# Patient Record
Sex: Female | Born: 1980 | Hispanic: Yes | Marital: Married | Smoking: Never smoker
Health system: Southern US, Community
[De-identification: ages and names within clinical notes are randomized; demographics above are authoritative.]

## PROBLEM LIST (undated history)

## (undated) DIAGNOSIS — K219 Gastro-esophageal reflux disease without esophagitis: Secondary | ICD-10-CM

## (undated) HISTORY — DX: Gastro-esophageal reflux disease without esophagitis: K21.9

---

## 2006-01-26 ENCOUNTER — Ambulatory Visit: Payer: Self-pay | Admitting: Family Medicine

## 2006-02-04 ENCOUNTER — Ambulatory Visit: Payer: Self-pay | Admitting: Family Medicine

## 2006-06-09 ENCOUNTER — Ambulatory Visit: Payer: Self-pay | Admitting: Advanced Practice Midwife

## 2006-06-17 ENCOUNTER — Ambulatory Visit: Payer: Self-pay | Admitting: Advanced Practice Midwife

## 2006-08-14 ENCOUNTER — Inpatient Hospital Stay: Payer: Self-pay | Admitting: Certified Nurse Midwife

## 2014-11-26 ENCOUNTER — Encounter: Payer: Self-pay | Admitting: Emergency Medicine

## 2014-11-26 ENCOUNTER — Emergency Department
Admission: EM | Admit: 2014-11-26 | Discharge: 2014-11-26 | Disposition: A | Discharge status: Home / Self Care | Payer: Self-pay | Attending: Student | Admitting: Student

## 2014-11-26 ENCOUNTER — Emergency Department: Payer: Self-pay

## 2014-11-26 ENCOUNTER — Other Ambulatory Visit: Payer: Self-pay

## 2014-11-26 DIAGNOSIS — K219 Gastro-esophageal reflux disease without esophagitis: Secondary | ICD-10-CM

## 2014-11-26 DIAGNOSIS — R52 Pain, unspecified: Secondary | ICD-10-CM

## 2014-11-26 DIAGNOSIS — R079 Chest pain, unspecified: Secondary | ICD-10-CM | POA: Insufficient documentation

## 2014-11-26 DIAGNOSIS — R1013 Epigastric pain: Secondary | ICD-10-CM

## 2014-11-26 DIAGNOSIS — Z3202 Encounter for pregnancy test, result negative: Secondary | ICD-10-CM | POA: Insufficient documentation

## 2014-11-26 LAB — CBC WITH DIFFERENTIAL/PLATELET
BASOS PCT: 1 %
Basophils Absolute: 0.1 10*3/uL (ref 0–0.1)
EOS ABS: 0.1 10*3/uL (ref 0–0.7)
Eosinophils Relative: 1 %
HCT: 35.9 % (ref 35.0–47.0)
Hemoglobin: 12.1 g/dL (ref 12.0–16.0)
LYMPHS ABS: 4.1 10*3/uL — AB (ref 1.0–3.6)
Lymphocytes Relative: 43 %
MCH: 30 pg (ref 26.0–34.0)
MCHC: 33.7 g/dL (ref 32.0–36.0)
MCV: 89 fL (ref 80.0–100.0)
MONO ABS: 0.9 10*3/uL (ref 0.2–0.9)
MONOS PCT: 10 %
Neutro Abs: 4.4 10*3/uL (ref 1.4–6.5)
Neutrophils Relative %: 45 %
Platelets: 364 10*3/uL (ref 150–440)
RBC: 4.03 MIL/uL (ref 3.80–5.20)
RDW: 13.8 % (ref 11.5–14.5)
WBC: 9.5 10*3/uL (ref 3.6–11.0)

## 2014-11-26 LAB — URINALYSIS COMPLETE WITH MICROSCOPIC (ARMC ONLY)
BILIRUBIN URINE: NEGATIVE
GLUCOSE, UA: NEGATIVE mg/dL
HGB URINE DIPSTICK: NEGATIVE
KETONES UR: NEGATIVE mg/dL
LEUKOCYTES UA: NEGATIVE
NITRITE: NEGATIVE
PH: 6 (ref 5.0–8.0)
Protein, ur: NEGATIVE mg/dL
RBC / HPF: NONE SEEN RBC/hpf (ref 0–5)
SPECIFIC GRAVITY, URINE: 1.002 — AB (ref 1.005–1.030)

## 2014-11-26 LAB — COMPREHENSIVE METABOLIC PANEL
ALBUMIN: 4 g/dL (ref 3.5–5.0)
ALT: 30 U/L (ref 14–54)
ANION GAP: 3 — AB (ref 5–15)
AST: 23 U/L (ref 15–41)
Alkaline Phosphatase: 69 U/L (ref 38–126)
BILIRUBIN TOTAL: 0.6 mg/dL (ref 0.3–1.2)
BUN: 15 mg/dL (ref 6–20)
CALCIUM: 8.7 mg/dL — AB (ref 8.9–10.3)
CO2: 26 mmol/L (ref 22–32)
Chloride: 109 mmol/L (ref 101–111)
Creatinine, Ser: 0.74 mg/dL (ref 0.44–1.00)
GFR calc non Af Amer: >60 mL/min (ref >60)
GLUCOSE: 107 mg/dL — AB (ref 65–99)
POTASSIUM: 3.7 mmol/L (ref 3.5–5.1)
SODIUM: 138 mmol/L (ref 135–145)
TOTAL PROTEIN: 7.4 g/dL (ref 6.5–8.1)

## 2014-11-26 LAB — TROPONIN I: Troponin I: <0.03 ng/mL (ref <0.031)

## 2014-11-26 LAB — LIPASE, BLOOD: Lipase: 19 U/L — ABNORMAL LOW (ref 22–51)

## 2014-11-26 LAB — POCT PREGNANCY, URINE: PREG TEST UR: NEGATIVE

## 2014-11-26 MED ORDER — OMEPRAZOLE 20 MG PO CPDR: 20.0000 mg | DELAYED_RELEASE_CAPSULE | Freq: Every day | ORAL | Status: AC

## 2014-11-26 MED ORDER — GI COCKTAIL ~~LOC~~
30.0000 mL | Freq: Once | ORAL | Status: AC
  Administered 2014-11-26: 30 mL via ORAL
  Filled 2014-11-26: qty 30

## 2014-11-26 NOTE — ED Notes (Signed)
Pt c/o epigastric pain with nausea and burning since Wednesday; left sided chest pain also since Wednesday; says she called the ambulance when the pain started that day but decided not to come in for evaluation; talking in complete coherent sentences

## 2014-11-26 NOTE — ED Notes (Signed)
Pt updated on progress of ultrasound report. Pt denies pain at present.

## 2014-11-26 NOTE — ED Notes (Signed)
Pt returned from ultrasound

## 2014-11-26 NOTE — ED Notes (Signed)
Pt updated on ultrasound procedure and progress to have exam done. Pt verbalizes understanding.

## 2014-11-26 NOTE — ED Provider Notes (Signed)
[facility] Emergency Department Provider Note  ____________________________________________  Time seen: Approximately 1:29 AM  I have reviewed the triage vital signs and the nursing notes.   HISTORY  Chief Complaint Chest Pain; Abdominal Pain; and Nausea    HPI Jodi Hamilton is a 34 y.o. female with no chronic medical problems who presents for gradual onset epigastric abdominal discomfort this evening which awoke her from sleep, currently mild. Patient reports that she awoke with burning pain in the epigastrium that radiated up into her chest and into her head. She felt warm all over. + nausea.  No vomiting, diarrhea, fevers or chills patient a similar episode 2 days ago but it resolved. She also reports yesterday she had some left-sided chest pain which was transient and resolved and she has not had any chest pain since that time. No family history of early coronary artery disease, no family history or personal history of PE or DVT, no family history of sudden cardiac death. There are no modifying factors.   History reviewed. No pertinent past medical history.  There are no active problems to display for this patient.   History reviewed. No pertinent past surgical history.  No current outpatient prescriptions on file.  Allergies Review of patient's allergies indicates no known allergies.  History reviewed. No pertinent family history.  Social History Social History  Substance Use Topics  . Smoking status: Never Smoker   . Smokeless tobacco: Never Used  . Alcohol Use: No    Review of Systems Constitutional: No fever/chills Eyes: No visual changes. ENT: No sore throat. Cardiovascular: +chest pain. Respiratory: Denies shortness of breath. Gastrointestinal: +abdominal pain.  + nausea, no vomiting.  No diarrhea.  No constipation. Genitourinary: Negative for dysuria. Musculoskeletal: Negative for back pain. Skin: Negative for  rash. Neurological: Negative for headaches, focal weakness or numbness.  10-point ROS otherwise negative.  ____________________________________________   PHYSICAL EXAM:  VITAL SIGNS: ED Triage Vitals  Enc Vitals Group     BP 11/26/14 0125 145/77 mmHg     Pulse Rate 11/26/14 0125 71     Resp 11/26/14 0125 18     Temp 11/26/14 0125 98.1 F (36.7 C)     Temp Source 11/26/14 0125 Oral     SpO2 11/26/14 0125 100 %     Weight 11/26/14 0125 180 lb (81.647 kg)     Height 11/26/14 0125  (1.651 m)     Head Cir --      Peak Flow --      Pain Score 11/26/14 0126 8     Pain Loc --      Pain Edu? --      Excl. in GC? --     Constitutional: Alert and oriented. Well appearing and in no acute distress. Eyes: Conjunctivae are normal. PERRL. EOMI. Head: Atraumatic. Nose: No congestion/rhinnorhea. Mouth/Throat: Mucous membranes are moist.  Oropharynx non-erythematous. Neck: No stridor.   Cardiovascular: Normal rate, regular rhythm. Grossly normal heart sounds.  Good peripheral circulation. Respiratory: Normal respiratory effort.  No retractions. Lungs CTAB. Gastrointestinal: Soft with mild tenderness to palpation in the epigastrium and the right upper quadrant. No CVA tenderness. Genitourinary: deferred Musculoskeletal: No lower extremity tenderness nor edema.  No joint effusions. Neurologic:  Normal speech and language. No gross focal neurologic deficits are appreciated. No gait instability. Skin:  Skin is warm, dry and intact. No rash noted. Psychiatric: Mood and affect are normal. Speech and behavior are normal.  ____________________________________________   LABS (all labs  ordered are listed, but only abnormal results are displayed)  Labs Reviewed  CBC WITH DIFFERENTIAL/PLATELET - Abnormal; Notable for the following:    Lymphs Abs 4.1 (*)    All other components within normal limits  COMPREHENSIVE METABOLIC PANEL - Abnormal; Notable for the following:    Glucose, Bld 107  (*)    Calcium 8.7 (*)    Anion gap 3 (*)    All other components within normal limits  LIPASE, BLOOD - Abnormal; Notable for the following:    Lipase 19 (*)    All other components within normal limits  URINALYSIS COMPLETEWITH MICROSCOPIC ([facility] ONLY) - Abnormal; Notable for the following:    Color, Urine COLORLESS (*)    APPearance CLEAR (*)    Specific Gravity, Urine 1.002 (*)    Bacteria, UA RARE (*)    Squamous Epithelial / LPF 0-5 (*)    All other components within normal limits  TROPONIN I  POC URINE PREG, ED  POCT PREGNANCY, URINE   ____________________________________________  EKG  ED ECG REPORT I, Gayla Doss, the attending physician, personally viewed and interpreted this ECG.   Date: 11/26/2014  EKG Time: 01:44  Rate: 69  Rhythm: normal sinus rhythm  Axis: normal  Intervals:none  ST&T Change: No acute ST elevation. Nonspecific T-wave flattening in lead 3 but otherwise normal EKG.  ____________________________________________  RADIOLOGY  Chest x-ray  IMPRESSION: No active cardiopulmonary disease.    Right upper quadrant ultrasound  IMPRESSION: Normal examination. ____________________________________________   PROCEDURES  Procedure(s) performed: None  Critical Care performed: No  ____________________________________________   INITIAL IMPRESSION / ASSESSMENT AND PLAN / ED COURSE  Pertinent labs & imaging results that were available during my care of the patient were reviewed by me and considered in my medical decision making (see chart for details).  Jodi Hamilton is a 34 y.o. female with no chronic medical problems who presents for gradual onset epigastric abdominal discomfort this evening which awoke her from sleep. On exam, she is well appearing and in NAD. VSS. Afebrile. She has mild tenderness in the epigastrium and the RUQ.  Symptoms may be related to GERD versus cholelithiasis or acute cholecystitis. EKG is reassuring there is  no evidence of acute ischemia. She last had chest pain yesterday. We'll check one troponin I doubt ACS in this patient. PERC negative and I doubt PE. We'll check screening chest x-ray. Plan for abdominal pain labs, urinalysis, urine pregnancy as well as right upper quadrant ultrasound to evaluate for any acute pathology. Reassess for disposition. We'll treat her symptomatically.  ----------------------------------------- 6:05 AM on 11/26/2014 ----------------------------------------- Labs reviewed. Normal CBC CMP and lipase. Urinalysis is not consistent with infection. Negative urine pregnancy test. Troponin negative. Chest x-ray clear. Ultrasound of the gallbladder is also negative. Patient's symptoms have improved significantly after GI cocktail. Suspect her symptoms may be related to GERD. We discussed return precautions, use of omeprazole, need for close PCP follow-up and she is comfortable with the discharge plan.  ____________________________________________   FINAL CLINICAL IMPRESSION(S) / ED DIAGNOSES  Final diagnoses:  Epigastric abdominal pain  Pain  Gastroesophageal reflux disease, esophagitis presence not specified      Gayla Doss, MD 11/26/14 [phone]

## 2014-11-26 NOTE — ED Notes (Signed)
Patient transported to Ultrasound 

## 2014-11-26 NOTE — ED Notes (Signed)
Pt denies pain at this time

## 2014-11-29 ENCOUNTER — Emergency Department: Payer: Worker's Compensation

## 2014-11-29 ENCOUNTER — Encounter: Payer: Self-pay | Admitting: Emergency Medicine

## 2014-11-29 ENCOUNTER — Emergency Department
Admission: EM | Admit: 2014-11-29 | Discharge: 2014-11-29 | Disposition: A | Discharge status: Home / Self Care | Payer: Worker's Compensation | Attending: Emergency Medicine | Admitting: Emergency Medicine

## 2014-11-29 DIAGNOSIS — Y9389 Activity, other specified: Secondary | ICD-10-CM | POA: Insufficient documentation

## 2014-11-29 DIAGNOSIS — Z79899 Other long term (current) drug therapy: Secondary | ICD-10-CM | POA: Insufficient documentation

## 2014-11-29 DIAGNOSIS — S50811A Abrasion of right forearm, initial encounter: Secondary | ICD-10-CM | POA: Insufficient documentation

## 2014-11-29 DIAGNOSIS — S51831A Puncture wound without foreign body of right forearm, initial encounter: Secondary | ICD-10-CM | POA: Insufficient documentation

## 2014-11-29 DIAGNOSIS — W540XXA Bitten by dog, initial encounter: Secondary | ICD-10-CM | POA: Insufficient documentation

## 2014-11-29 DIAGNOSIS — Y998 Other external cause status: Secondary | ICD-10-CM | POA: Insufficient documentation

## 2014-11-29 DIAGNOSIS — Y9259 Other trade areas as the place of occurrence of the external cause: Secondary | ICD-10-CM | POA: Insufficient documentation

## 2014-11-29 DIAGNOSIS — Z23 Encounter for immunization: Secondary | ICD-10-CM | POA: Insufficient documentation

## 2014-11-29 MED ORDER — AMOXICILLIN-POT CLAVULANATE 875-125 MG PO TABS: 1.0000 | ORAL_TABLET | Freq: Two times a day (BID) | ORAL | Status: DC

## 2014-11-29 MED ORDER — ONDANSETRON 4 MG PO TBDP
4.0000 mg | ORAL_TABLET | Freq: Once | ORAL | Status: AC
  Administered 2014-11-29: 4 mg via ORAL
  Filled 2014-11-29: qty 1

## 2014-11-29 MED ORDER — RABIES IMMUNE GLOBULIN 150 UNIT/ML IM INJ
20.0000 [IU]/kg | INJECTION | Freq: Once | INTRAMUSCULAR | Status: AC
  Administered 2014-11-29: 1650 [IU] via INTRAMUSCULAR
  Filled 2014-11-29: qty 11

## 2014-11-29 MED ORDER — RABIES VACCINE, PCEC IM SUSR
1.0000 mL | Freq: Once | INTRAMUSCULAR | Status: AC
  Administered 2014-11-29: 1 mL via INTRAMUSCULAR
  Filled 2014-11-29: qty 1

## 2014-11-29 MED ORDER — TETANUS-DIPHTH-ACELL PERTUSSIS 5-2.5-18.5 LF-MCG/0.5 IM SUSP
0.5000 mL | Freq: Once | INTRAMUSCULAR | Status: AC
  Administered 2014-11-29: 0.5 mL via INTRAMUSCULAR
  Filled 2014-11-29: qty 0.5

## 2014-11-29 MED ORDER — AMOXICILLIN-POT CLAVULANATE 875-125 MG PO TABS
1.0000 | ORAL_TABLET | Freq: Once | ORAL | Status: AC
  Administered 2014-11-29: 1 via ORAL
  Filled 2014-11-29: qty 1

## 2014-11-29 NOTE — ED Notes (Signed)
Pt was working at [employer] when she walked down the hall and was bitten by a person who was walking their dog.  No information about the dog at this time.

## 2014-11-29 NOTE — ED Provider Notes (Signed)
[facility] Emergency Department Provider Note  ____________________________________________  Time seen: Approximately 12:09 PM  I have reviewed the triage vital signs and the nursing notes.   HISTORY  Chief Complaint Animal Bite    HPI Jodi Hamilton is a 34 y.o. female who presents to the emergency department for evaluation of a dog bite. 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 She states that the lesion was not tight enough and the dog jumped up and bit her right arm. She states that it appeared to be enlarged, possibly a pit bull. She states that her supervisor notified animal control. With animal control arrived, the owners had left with the dog. The owners had stated that the dog was up-to-date on his vaccinations, but she has no idea where the dog is now. The owners checked out of the hotel.   History reviewed. No pertinent past medical history.  There are no active problems to display for this patient.   History reviewed. No pertinent past surgical history.  Current Outpatient Rx  Name  Route  Sig  Dispense  Refill  . amoxicillin-clavulanate (AUGMENTIN) 875-125 MG tablet   Oral   Take 1 tablet by mouth 2 (two) times daily.   20 tablet   0   . omeprazole (PRILOSEC) 20 MG capsule   Oral   Take 1 capsule (20 mg total) by mouth daily.   30 capsule   0     Allergies Review of patient's allergies indicates no known allergies.  No family history on file.  Social History Social History  Substance Use Topics  . Smoking status: Never Smoker   . Smokeless tobacco: Never Used  . Alcohol Use: No    Review of Systems   Constitutional: No fever/chills Eyes: No visual changes. ENT: No congestion or rhinorrhea Cardiovascular: Denies chest pain. Respiratory: Denies shortness of breath. Gastrointestinal: No abdominal pain.  No nausea, no vomiting.  No diarrhea.  No  constipation. Genitourinary: Negative for dysuria. Musculoskeletal: Negative for back pain. Skin: Positive for bite/laceration to the right upper forearm. Neurological: Negative for headaches, focal weakness or numbness.  10-point ROS otherwise negative.  ____________________________________________   PHYSICAL EXAM:  VITAL SIGNS: ED Triage Vitals  Enc Vitals Group     BP 11/29/14 1145 137/83 mmHg     Pulse Rate 11/29/14 1145 83     Resp 11/29/14 1145 18     Temp 11/29/14 1145 98.1 F (36.7 C)     Temp Source 11/29/14 1145 Oral     SpO2 11/29/14 1145 100 %     Weight 11/29/14 1207 180 lb (81.647 kg)     Height 11/29/14 1207  (1.651 m)     Head Cir --      Peak Flow --      Pain Score 11/29/14 1145 6     Pain Loc --      Pain Edu? --      Excl. in GC? --     Constitutional: Alert and oriented. Well appearing and in no acute distress. Eyes: Conjunctivae are normal. PERRL. EOMI. Head: Atraumatic. Nose: No congestion/rhinnorhea. Mouth/Throat: Mucous membranes are moist.  Oropharynx non-erythematous. No oral lesions. Neck: No stridor. Cardiovascular: Normal rate, regular rhythm.  Good peripheral circulation. Respiratory: Normal respiratory effort.  No retractions. Lungs CTAB. Gastrointestinal: Soft and nontender. No distention. No abdominal bruits.  Musculoskeletal: No lower extremity tenderness nor edema.  No joint effusions. Right lateral forearm/elbow tender to palpation. Full range of motion of the right elbow. Neurologic:  Normal speech and language. No gross focal neurologic deficits are appreciated. Speech is normal. No gait instability. Skin:  Small puncture wound on the anterior aspect of the upper right forearm, abrasion to the lateral right forearm; Negative for petechiae.  Psychiatric: Mood and affect are normal. Speech and behavior are normal.  ____________________________________________   LABS (all labs ordered are listed, but only abnormal results are  displayed)  Labs Reviewed - No data to display ____________________________________________  EKG   ____________________________________________  RADIOLOGY  No acute bony abnormality or foreign body noted in the right elbow. ____________________________________________   PROCEDURES  Procedure(s) performed: None ____________________________________________   INITIAL IMPRESSION / ASSESSMENT AND PLAN / ED COURSE  Pertinent labs & imaging results that were available during my care of the patient were reviewed by me and considered in my medical decision making (see chart for details).  Because the whereabouts of the dog is unknown and there is no way to confirm that the vaccinations are up-to-date, patient will undergo rabies series vaccinations. This was discussed with the use of Maryjane HurterOtto, our [place] interpreter. Patient agrees to the treatment and understands that she will need to return for the remainder of the series. ____________________________________________   FINAL CLINICAL IMPRESSION(S) / ED DIAGNOSES  Final diagnoses:  Dog bite  Rabies, need for prophylactic vaccination against       Chinita PesterCari B Rudy Luhmann, FNP 11/29/14 1416  Sharyn CreamerMark Quale, MD 11/29/14 1557

## 2014-11-29 NOTE — ED Notes (Signed)
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

## 2014-12-03 ENCOUNTER — Emergency Department
Admission: EM | Admit: 2014-12-03 | Discharge: 2014-12-03 | Disposition: A | Discharge status: Home / Self Care | Payer: Self-pay | Attending: Emergency Medicine | Admitting: Emergency Medicine

## 2014-12-03 ENCOUNTER — Encounter: Payer: Self-pay | Admitting: Emergency Medicine

## 2014-12-03 DIAGNOSIS — Z79899 Other long term (current) drug therapy: Secondary | ICD-10-CM | POA: Insufficient documentation

## 2014-12-03 DIAGNOSIS — Z792 Long term (current) use of antibiotics: Secondary | ICD-10-CM | POA: Insufficient documentation

## 2014-12-03 DIAGNOSIS — Z23 Encounter for immunization: Secondary | ICD-10-CM | POA: Insufficient documentation

## 2014-12-03 DIAGNOSIS — Z203 Contact with and (suspected) exposure to rabies: Secondary | ICD-10-CM

## 2014-12-03 MED ORDER — RABIES VACCINE, PCEC IM SUSR
1.0000 mL | Freq: Once | INTRAMUSCULAR | Status: AC
  Administered 2014-12-03: 1 mL via INTRAMUSCULAR
  Filled 2014-12-03: qty 1

## 2014-12-03 NOTE — ED Notes (Signed)
Here for repeat rabies shot

## 2014-12-03 NOTE — ED Notes (Signed)
Only here for rabies vaccine

## 2014-12-03 NOTE — ED Provider Notes (Signed)
[facility] Emergency Department Provider Note ____________________________________________  Time seen: 1054  I have reviewed the triage vital signs and the nursing notes.  HISTORY  Chief Complaint  Rabies Injection  HPI Jodi Hamilton is a 34 y.o. female reports to the ED for routine rabies vaccine #2 of 4 today. She denies any significant interim symptoms related to the dog bite site or the vaccine itself. She does endorse some intermittent anxiety and insomnia related to the recent dog bite. She will reports she has had some poor sleep over the last several nights, and also has been bothered by the barking of her son's pet puppy. She describes a sense of holding pressure in her stomach and chest as well as some sweats and nausea. She denies any syncope, distal paresthesias, or focal weakness. She has not dosed any medications by prescription or over-the-counter for sleep induction or anxiety.  History reviewed. No pertinent past medical history.  There are no active problems to display for this patient.  History reviewed. No pertinent past surgical history.  Current Outpatient Rx  Name  Route  Sig  Dispense  Refill  . amoxicillin-clavulanate (AUGMENTIN) 875-125 MG tablet   Oral   Take 1 tablet by mouth 2 (two) times daily.   20 tablet   0   . omeprazole (PRILOSEC) 20 MG capsule   Oral   Take 1 capsule (20 mg total) by mouth daily.   30 capsule   0    Allergies Review of patient's allergies indicates no known allergies.  No family history on file.  Social History Social History  Substance Use Topics  . Smoking status: Never Smoker   . Smokeless tobacco: Never Used  . Alcohol Use: No   Review of Systems  Constitutional: Negative for fever. Eyes: Negative for visual changes. ENT: Negative for sore throat. Cardiovascular: Negative for chest pain. Respiratory: Negative for shortness of breath. Gastrointestinal: Negative for abdominal  pain, vomiting and diarrhea. Genitourinary: Negative for dysuria. Musculoskeletal: Negative for back pain. Skin: Negative for rash. Neurological: Negative for headaches, focal weakness or numbness.  Psychological: Mild insomnia and anxiety as above. ____________________________________________  PHYSICAL EXAM:  VITAL SIGNS: ED Triage Vitals  Enc Vitals Group     BP 12/03/14 1033 126/78 mmHg     Pulse Rate 12/03/14 1033 62     Resp 12/03/14 1033 18     Temp 12/03/14 1033 98.5 F (36.9 C)     Temp Source 12/03/14 1033 Oral     SpO2 12/03/14 1033 98 %     Weight 12/03/14 1025 190 lb (86.183 kg)     Height 12/03/14 1025  (1.651 m)     Head Cir --      Peak Flow --      Pain Score --      Pain Loc --      Pain Edu? --      Excl. in GC? --    Constitutional: Alert and oriented. Well appearing and in no distress. Head: Normocephalic and atraumatic.      Eyes: Conjunctivae are normal. PERRL. Normal extraocular movements      Ears: Canals clear. TMs intact bilaterally.   Nose: No congestion/rhinorrhea.   Mouth/Throat: Mucous membranes are moist.   Neck: Supple. No thyromegaly. Hematological/Lymphatic/Immunological: No cervical lymphadenopathy. Cardiovascular: Normal rate, regular rhythm.  Respiratory: Normal respiratory effort. No wheezes/rales/rhonchi. Gastrointestinal: Soft and nontender. No distention. Musculoskeletal: Nontender with normal range of motion in all extremities.  Neurologic:  Normal gait without ataxia. Normal speech and language. No gross focal neurologic deficits are appreciated. Skin:  Skin is warm, dry and intact. No rash noted. Well healed dog bite to the right elbow. Psychiatric: Mood and affect are normal. Patient exhibits appropriate insight and judgment. ____________________________________________  PROCEDURES  Rabavert 1 ml IM ____________________________________________  INITIAL IMPRESSION / ASSESSMENT AND PLAN / ED COURSE  Patient  turns to the ED for rabies vaccine injection #2 of 4. Patient is encouraged to began over-the-counter pain medicine + sleep a combination, for her current intermittent complaints of insomnia. She will follow-up with provider here in 1 week for reevaluation and consideration of a prescription sleep/anxiety prescription. ____________________________________________  FINAL CLINICAL IMPRESSION(S) / ED DIAGNOSES  Final diagnoses:  Need for post exposure prophylaxis for rabies       Lissa HoardJenise V Bacon Adael Culbreath, PA-C 12/03/14 1137  Jene Everyobert Kinner, MD 12/03/14 1150

## 2014-12-03 NOTE — Discharge Instructions (Signed)
Vacuna contra la rabia, Lo que usted necesita saber (Rabies Vaccine: What You Need to Know) QU ES LA RABIA?  La rabia es una enfermedad grave. Es causada por un virus.  La rabia es una enfermedad que principalmente afecta a los [place]. Los humanos contraen la rabia cuando son mordidos por animales infectados.  Al principio puede que no haya ningn sntoma. Pero semanas, o incluso meses despus de [place] mordida, la rabia puede causar dolor, fatiga, dolores de [place], fiebre e irritabilidad. Despus de estos sntomas se presentan convulsiones, alucinaciones y parlisis. La rabia humana casi siempre es mortal.  Los animales salvajes (especialmente los murcilagos) son la fuente ms comn de la infeccin de la rabia [employer] [place]. Zorrillos, mapaches, perros, gatos, coyotes, zorros, y otros mamferos tambin que pueden transmitir la enfermedad.  La rabia humana es poco frecuente en los [place]. Solamente se han diagnosticado 55 caso desde 1990. Sin embargo, entre 16,000 y 39,000 personas son vacunadas cada ao como precaucin despus de haber sido mordidas por animales. Adems, la rabia es mucho ms comn en otras partes del mundo, con aproximadamente 40,000 a 70,000 muertes relacionadas con la rabia a nivel mundial cada ao. Las mordidas de perros que no han sido vacunados causan la mayora de [place]. La vacuna contra la rabia puede prevenir la rabia. VACUNA CONTRA LA RABIA  La vacuna contra la rabia se aplica a las personas que estn en alto riesgo de contraer rabia para protegerlas si son expuestas. Tambin puede prevenir la enfermedad si se aplica a una persona despus de que haya sido expuesta.  La vacuna contra la rabia est hecha de virus muerto de la rabia. No puede causar rabia. QUIN DEBE VACUNARSE CONTRA LA RABIA Y CUNDO? Madilyn Fireman preventiva (sin exposicin)  A las personas con alto riesgo de Primary school teacher rabia, como veterinarios, encargados de Administrator,  trabajadores de laboratorio que manipulan el virus de la rabia, espelelogos y Museum/gallery exhibitions officer de produccin de compuestos biolgicos relacionados con la rabia se les debe ofrecer la vacuna contra la rabia.  La vacuna tambin debe considerarse para:  Las personas cuyas actividades las expongan a Pharmacologist frecuente con el virus de la rabia o con animales posiblemente rabiosos.  Los viajeros internacionales que podran entrar en contacto con animales en lugares del mundo en donde la rabia es comn.  El esquema de preexposicin para la vacuna contra la rabia es de 3 dosis, administradas en los siguientes momentos:  Dosis 1: Segn corresponda.  Dosis 2: 7 das despus de la Dosis 1.  Dosis 3: 21 o 28 das despus de la Dosis 1.  Para los trabajadores de laboratorios y otros que podran estar expuestos de forma repetida al virus de la rabia, se recomiendan anlisis peridicos de inmunidad y se deben aplicar dosis de refuerzo segn sea necesario. (Los [employer] o las dosis de refuerzo no se recomiendan para los viajeros). Pdale ms detalles a su mdico. Vacuna despus de la exposicin Cualquier persona que haya sido mordida por un animal, o que haya estado expuesta al virus de la rabia de otra forma debe limpiarse la herida y acudir con un mdico de inmediato. El mdico determinar si necesita vacunarse.  Una persona que haya estado expuesta y que nunca haya sido vacunada contra la rabia debe recibir 4 dosis de la vacuna contra la rabia: Neomia Dear dosis de inmediato y dosis adicionales al 3., 7. y 48. das. Tambin deben recibir otra inyeccin que se llama inmunoglobulina contra la rabia al  mismo tiempo que la primera dosis.  Una persona que ha sido vacunada previamente debe recibir 2 dosis de la vacuna contra la rabia: Neomia Dearuna de inmediato y otra al 3. da. No es necesario que se le aplique la inmunoglobulina contra la rabia. INFORME A SU MDICO SI: Hable con un mdico antes de vacunarse contra la rabia  si:  Ha tenido Freight forwarderalguna reaccin alrgica grave (que pueda poner en peligro la vida) a una dosis anterior de la vacuna contra la rabia o a algn componente de la vacuna; informe a su mdico si tiene alguna alergia severa.  Su sistema inmunitario est debilitado a causa de:  VIH, SIDA, u otra enfermedad que afecte al sistema inmunitario.  Un tratamiento con frmacos que afectan al sistema inmunitario, como los esteroides.  Cncer, o tratamiento para el cncer con radiacin o medicamentos. Si tiene United Parceluna enfermedad menor, como gripe, puede vacunarse. Si tiene una enfermedad moderada o severa, tal vez debera esperar hasta recuperarse antes de recibir una dosis de rutina (sin exposicin) de la vacuna contra la rabia. Si ha estado expuesto al virus de la rabia, debe vacunarse independientemente de cualquier otra enfermedad que pudiera tener. CULES SON LOS RIESGOS DE LA VACUNA CONTRA LA RABIA? Cathleen CortiUna vacuna, al igual que cualquier [place] Lakemedicina, puede provocar problemas graves, como reacciones Economistalrgicas severas. El riesgo de que una vacuna ocasione un dao grave, o la Munsonmuerte, es extremadamente pequeo. Los problemas graves provocados por la vacuna contra la rabia son Lynnae Sandhoffmuy raros.  Problemas leves:  Dolor, enrojecimiento, hinchazn o comezn en donde se aplic la inyeccin (30% a 74%).  Dolor de cabeza, nuseas, dolor abdominal, dolores musculares, o mareos (5% a 40%). Problemas moderados:  Urticaria, dolor en las articulaciones, o fiebre (aproximadamente 6% de las dosis de refuerzo).  Se han reportado otros trastornos del Harley-Davidsonsistema nervioso, como el sndrome de Scientific laboratory technicianGuillain Barr (GBS), despus de recibir la vacuna contra la rabia, pero esto sucede con tan poca frecuencia que no se sabe si estn relacionados con la vacuna. Nota: Muchas marcas de vacunas contra la rabia estn disponibles en los [place], y las reacciones pueden variar dependiendo de Radio producercada marca. Su proveedor le puede dar ms informacin sobre  una marca en particular.  QU HACER EN CASO DE UNA REACCIN MODERADA O SEVERA? De qu debo estar pendiente? De todo signo inusual, como una reaccin alrgica severa o fiebre alta. Si ocurriera una reaccin alrgica severa, esta debera presentarse en cuestin de minutos y Angelena Formhasta una hora despus de la inyeccin. Los signos de Runner, broadcasting/film/videouna reaccin alrgica grave pueden incluir dificultad para respirar, debilidad, ronquera o jadeos, pulso acelerado, urticaria, mareos, palidez o hinchazn de la garganta. Qu debo hacer?  Llame a un mdico o lleve a la persona al mdico de inmediato.  Dgale al mdico lo que ocurri, la fecha y la hora en la que ocurri, y cundo le pusieron la vacuna.  Pdale a su proveedor que reporte la reaccin presentando un formulario del Sistema de reporte de eventos adversos derivados de las vacuna (Vaccine Adverse Event Reporting System, VAERS). O puede presentar este reporte a travs del sitio web de VAERS en www.vaers.LAgents.nohhs.gov o llamando al [phone]. El VAERS no ofrece consejos mdicos. DNDE Roxan DieselPUEDO OBTENER MS INFORMACIN?  Consulte a su mdico o a otro proveedor de servicios mdicos. Ellos pueden darle el folleto informativo de la vacuna o sugerirle otras fuentes de informacin.  Llame al departamento de salud local o estatal.  Comunquese con los Centers para Air traffic controllerel Control y Prevencin de  Enfermedades Systems developer for Disease Control snd Prevention, CDC).  Visite el sitio web sobre la rabia de los CDC en SouthwestBand.no CDC Rabies Vaccine-Spanish VIS (11/22/07)   Esta informacin no tiene Theme park manager el consejo del mdico. Asegrese de hacerle al mdico cualquier pregunta que tenga.   Document Released: 05/01/2008 Document Revised: 06/19/2014 Elsevier Interactive Patient Education [employer]  Rabies Vaccine Schedule Day 0 -   11/30/14 - DONE Day 3 -    12/03/14 - DONE Day 7 -    12/07/14 - #3 Day 14 -  12/14/14 - #4

## 2014-12-08 ENCOUNTER — Emergency Department
Admission: EM | Admit: 2014-12-08 | Discharge: 2014-12-08 | Disposition: A | Discharge status: Home / Self Care | Payer: Self-pay | Attending: Emergency Medicine | Admitting: Emergency Medicine

## 2014-12-08 DIAGNOSIS — Z23 Encounter for immunization: Secondary | ICD-10-CM | POA: Insufficient documentation

## 2014-12-08 DIAGNOSIS — Z792 Long term (current) use of antibiotics: Secondary | ICD-10-CM | POA: Insufficient documentation

## 2014-12-08 DIAGNOSIS — Z79899 Other long term (current) drug therapy: Secondary | ICD-10-CM | POA: Insufficient documentation

## 2014-12-08 MED ORDER — RABIES VACCINE, PCEC IM SUSR
1.0000 mL | Freq: Once | INTRAMUSCULAR | Status: AC
  Administered 2014-12-08: 1 mL via INTRAMUSCULAR
  Filled 2014-12-08: qty 1

## 2014-12-08 NOTE — ED Provider Notes (Signed)
[facility] Emergency Department Provider Note  ____________________________________________  Time seen: Approximately 9:54 AM  I have reviewed the triage vital signs and the nursing notes.   HISTORY  Chief Complaint Rabies Injection  HPI Jodi Hamilton is a 34 y.o. female is here for day 7 rabies vaccine. The patient denies any problems with her wound and has nearly healed up completely without any signs of infection.   No past medical history on file.  There are no active problems to display for this patient.   No past surgical history on file.  Current Outpatient Rx  Name  Route  Sig  Dispense  Refill  . amoxicillin-clavulanate (AUGMENTIN) 875-125 MG tablet   Oral   Take 1 tablet by mouth 2 (two) times daily.   20 tablet   0   . omeprazole (PRILOSEC) 20 MG capsule   Oral   Take 1 capsule (20 mg total) by mouth daily.   30 capsule   0     Allergies Review of patient's allergies indicates no known allergies.  No family history on file.  Social History Social History  Substance Use Topics  . Smoking status: Never Smoker   . Smokeless tobacco: Never Used  . Alcohol Use: No    Review of Systems Constitutional: No fever/positive chills at 4 AM but reports no chills since that time. Cardiovascular: Denies chest pain. Respiratory: Denies shortness of breath. Gastrointestinal: .  No nausea, no vomiting. Musculoskeletal: Negative for back pain. Skin: Negative for rash. Neurological: Negative for headaches, focal weakness or numbness.  10-point ROS otherwise negative.  ____________________________________________   PHYSICAL EXAM:  VITAL SIGNS: ED Triage Vitals  Enc Vitals Group     BP 12/08/14 0935 110/78 mmHg     Pulse Rate 12/08/14 0935 67     Resp 12/08/14 0935 18     Temp 12/08/14 0935 98.7 F (37.1 C)     Temp Source 12/08/14 0935 Oral     SpO2 12/08/14 0935 100 %     Weight 12/08/14 0935 180 lb (81.647 kg)   Height 12/08/14 0935 5\' 5"  (1.651 m)     Head Cir --      Peak Flow --      Pain Score --      Pain Loc --      Pain Edu? --      Excl. in GC? --     Constitutional: Alert and oriented. Well appearing and in no acute distress. Eyes: Conjunctivae are normal. PERRL. EOMI. Head: Atraumatic. Nose: No congestion/rhinnorhea. Respiratory: Normal respiratory effort.  No retractions. Lungs CTAB. Skin:  Skin is warm, dry. Right elbow with superficial healing wound. No signs of infection were seen. Psychiatric: Mood and affect are normal. Speech and behavior are normal.  ____________________________________________   LABS (all labs ordered are listed, but only abnormal results are displayed)  Labs Reviewed - No data to display ____________________________________________   PROCEDURES  Procedure(s) performed: None  Critical Care performed: No  ____________________________________________   INITIAL IMPRESSION / ASSESSMENT AND PLAN / ED COURSE  Pertinent labs & imaging results that were available during my care of the patient were reviewed by me and considered in my medical decision making (see chart for details).  Patient was given her rabies injection. ____________________________________________   FINAL CLINICAL IMPRESSION(S) / ED DIAGNOSES  Final diagnoses:  Need for prophylactic vaccination against rabies      Tommi RumpsRhonda L Summers, PA-C 12/08/14 1031  Sharyn CreamerMark Quale, MD 12/08/14 [phone]

## 2014-12-08 NOTE — ED Notes (Signed)
Pt tolerated Rabies vaccine without difficulty.

## 2014-12-08 NOTE — ED Notes (Signed)
Day 7 rabies vaccine

## 2014-12-20 ENCOUNTER — Emergency Department
Admission: EM | Admit: 2014-12-20 | Discharge: 2014-12-20 | Disposition: A | Discharge status: Home / Self Care | Payer: Self-pay | Attending: Emergency Medicine | Admitting: Emergency Medicine

## 2014-12-20 ENCOUNTER — Encounter: Payer: Self-pay | Admitting: Emergency Medicine

## 2014-12-20 ENCOUNTER — Emergency Department: Payer: Self-pay

## 2014-12-20 DIAGNOSIS — Z23 Encounter for immunization: Secondary | ICD-10-CM | POA: Insufficient documentation

## 2014-12-20 DIAGNOSIS — R05 Cough: Secondary | ICD-10-CM | POA: Insufficient documentation

## 2014-12-20 DIAGNOSIS — H9203 Otalgia, bilateral: Secondary | ICD-10-CM | POA: Insufficient documentation

## 2014-12-20 DIAGNOSIS — R0981 Nasal congestion: Secondary | ICD-10-CM | POA: Insufficient documentation

## 2014-12-20 DIAGNOSIS — Z792 Long term (current) use of antibiotics: Secondary | ICD-10-CM | POA: Insufficient documentation

## 2014-12-20 DIAGNOSIS — R6883 Chills (without fever): Secondary | ICD-10-CM | POA: Insufficient documentation

## 2014-12-20 DIAGNOSIS — R300 Dysuria: Secondary | ICD-10-CM | POA: Insufficient documentation

## 2014-12-20 DIAGNOSIS — R350 Frequency of micturition: Secondary | ICD-10-CM | POA: Insufficient documentation

## 2014-12-20 DIAGNOSIS — Z79899 Other long term (current) drug therapy: Secondary | ICD-10-CM | POA: Insufficient documentation

## 2014-12-20 DIAGNOSIS — F419 Anxiety disorder, unspecified: Secondary | ICD-10-CM | POA: Insufficient documentation

## 2014-12-20 LAB — URINALYSIS COMPLETE WITH MICROSCOPIC (ARMC ONLY)
Bacteria, UA: NONE SEEN
Bilirubin Urine: NEGATIVE
Glucose, UA: NEGATIVE mg/dL
Ketones, ur: NEGATIVE mg/dL
Leukocytes, UA: NEGATIVE
Nitrite: NEGATIVE
Protein, ur: NEGATIVE mg/dL
RBC / HPF: NONE SEEN RBC/hpf (ref 0–5)
Specific Gravity, Urine: 1.002 — ABNORMAL LOW (ref 1.005–1.030)
WBC, UA: NONE SEEN WBC/hpf (ref 0–5)
pH: 6 (ref 5.0–8.0)

## 2014-12-20 MED ORDER — RABIES VACCINE, PCEC IM SUSR
1.0000 mL | Freq: Once | INTRAMUSCULAR | Status: AC
  Administered 2014-12-20: 1 mL via INTRAMUSCULAR
  Filled 2014-12-20: qty 1

## 2014-12-20 MED ORDER — IPRATROPIUM-ALBUTEROL 0.5-2.5 (3) MG/3ML IN SOLN: 3.0000 mL | Freq: Once | RESPIRATORY_TRACT | Status: DC

## 2014-12-20 NOTE — ED Notes (Signed)
States she needs her rabies vaccine  But also has had some burning/chills in chest . Pain to ears and having some dysuria

## 2014-12-20 NOTE — ED Notes (Signed)
See triage.. States she just doesn't feel well.  Unable to sleep at night

## 2014-12-20 NOTE — ED Provider Notes (Signed)
[facility] Emergency Department Provider Note ____________________________________________  Time seen: Approximately 8:17 AM  I have reviewed the triage vital signs and the nursing notes.   HISTORY  Chief Complaint Influenza  per Spanish interpreter   HPI Jodi Hamilton is a 34 y.o. female is here for rabies vaccine.Also per the Spanish interpreter patient is having bilateral ear pain, more pain, hot sensation at night, and urinary frequency. Patient denies any UTIs in the past. She is unaware of any fever but does acknowledge some chills. She denies any upper respiratory symptoms such as cough, congestion or sore throat. She states she's been taking Tylenol and ibuprofen without any relief. The discomfort is keeping her from sleeping at night. Symptoms are worse at night and they are during the day.   History reviewed. No pertinent past medical history.  There are no active problems to display for this patient.   History reviewed. No pertinent past surgical history.  Current Outpatient Rx  Name  Route  Sig  Dispense  Refill  . amoxicillin-clavulanate (AUGMENTIN) 875-125 MG tablet   Oral   Take 1 tablet by mouth 2 (two) times daily.   20 tablet   0   . omeprazole (PRILOSEC) 20 MG capsule   Oral   Take 1 capsule (20 mg total) by mouth daily.   30 capsule   0     Allergies Review of patient's allergies indicates no known allergies.  No family history on file.  Social History Social History  Substance Use Topics  . Smoking status: Never Smoker   . Smokeless tobacco: Never Used  . Alcohol Use: No    Review of Systems Constitutional: Possible fever/positive chills Eyes: No visual changes. ENT: No sore throat. Positive bilateral ear pain. Cardiovascular: Denies chest pain. Respiratory: Denies shortness of breath. Gastrointestinal: No abdominal pain.  No nausea, no vomiting.  No diarrhea.  No constipation. Genitourinary: Positive for  dysuria. Musculoskeletal: Negative for back pain. Skin: Negative for rash. Neurological: Negative for headaches, focal weakness or numbness.  10-point ROS otherwise negative.  ____________________________________________   PHYSICAL EXAM:  VITAL SIGNS: ED Triage Vitals  Enc Vitals Group     BP --      Pulse --      Resp --      Temp --      Temp src --      SpO2 --      Weight --      Height --      Head Cir --      Peak Flow --      Pain Score --      Pain Loc --      Pain Edu? --      Excl. in GC? --     Constitutional: Alert and oriented. Well appearing and in no acute distress. Eyes: Conjunctivae are normal. PERRL. EOMI. Head: Atraumatic. Nose: No congestion/rhinnorhea. EACs and TMs are clear bilaterally. Mouth/Throat: Mucous membranes are moist.  Oropharynx non-erythematous. Neck: No stridor.   Hematological/Lymphatic/Immunilogical: No cervical lymphadenopathy. Cardiovascular: Normal rate, regular rhythm. Grossly normal heart sounds.  Good peripheral circulation. Respiratory: Normal respiratory effort.  No retractions. Lungs CTAB. Gastrointestinal: Soft and nontender. No distention. No CVA tenderness. Musculoskeletal: No lower extremity tenderness nor edema.  No joint effusions. Neurologic:  Normal speech and language. No gross focal neurologic deficits are appreciated. No gait instability. Skin:  Skin is warm, dry and intact. No rash noted. Psychiatric: Mood and affect are normal. Speech  and behavior are normal.  ____________________________________________   LABS (all labs ordered are listed, but only abnormal results are displayed)  Labs Reviewed  URINALYSIS COMPLETEWITH MICROSCOPIC ([facility] ONLY) - Abnormal; Notable for the following:    Color, Urine COLORLESS (*)    APPearance CLEAR (*)    Specific Gravity, Urine 1.002 (*)    Hgb urine dipstick 2+ (*)    Squamous Epithelial / LPF 0-5 (*)    All other components within normal limits     PROCEDURES  Procedure(s) performed: None  Critical Care performed: No  ____________________________________________   INITIAL IMPRESSION / ASSESSMENT AND PLAN / ED COURSE  Pertinent labs & imaging results that were available during my care of the patient were reviewed by me and considered in my medical decision making (see chart for details).  Patient was reassured that her lab work was within normal limits. Patient was given her rabies vaccine today. She is to increase fluids and take Tylenol as needed. Patient follow-up with her primary care doctor if any continued problems or return to the emergency room if any severe worsening of her symptoms.  ____________________________________________   FINAL CLINICAL IMPRESSION(S) / ED DIAGNOSES  Final diagnoses:  Need for rabies vaccination  Anxiety      Tommi RumpsRhonda L Shley Dolby, PA-C 12/20/14 1443  Phineas SemenGraydon Goodman, MD 12/20/14 1452

## 2014-12-26 ENCOUNTER — Emergency Department
Admission: EM | Admit: 2014-12-26 | Discharge: 2014-12-26 | Disposition: A | Discharge status: Home / Self Care | Payer: Self-pay | Attending: Emergency Medicine | Admitting: Emergency Medicine

## 2014-12-26 ENCOUNTER — Encounter: Payer: Self-pay | Admitting: Emergency Medicine

## 2014-12-26 ENCOUNTER — Emergency Department: Payer: Self-pay

## 2014-12-26 DIAGNOSIS — M25512 Pain in left shoulder: Secondary | ICD-10-CM | POA: Insufficient documentation

## 2014-12-26 DIAGNOSIS — F419 Anxiety disorder, unspecified: Secondary | ICD-10-CM | POA: Insufficient documentation

## 2014-12-26 DIAGNOSIS — M542 Cervicalgia: Secondary | ICD-10-CM | POA: Insufficient documentation

## 2014-12-26 DIAGNOSIS — Z79899 Other long term (current) drug therapy: Secondary | ICD-10-CM | POA: Insufficient documentation

## 2014-12-26 DIAGNOSIS — G44209 Tension-type headache, unspecified, not intractable: Secondary | ICD-10-CM | POA: Insufficient documentation

## 2014-12-26 DIAGNOSIS — M546 Pain in thoracic spine: Secondary | ICD-10-CM | POA: Insufficient documentation

## 2014-12-26 DIAGNOSIS — R52 Pain, unspecified: Secondary | ICD-10-CM

## 2014-12-26 DIAGNOSIS — R197 Diarrhea, unspecified: Secondary | ICD-10-CM | POA: Insufficient documentation

## 2014-12-26 DIAGNOSIS — Z792 Long term (current) use of antibiotics: Secondary | ICD-10-CM | POA: Insufficient documentation

## 2014-12-26 DIAGNOSIS — G4700 Insomnia, unspecified: Secondary | ICD-10-CM | POA: Insufficient documentation

## 2014-12-26 DIAGNOSIS — R531 Weakness: Secondary | ICD-10-CM | POA: Insufficient documentation

## 2014-12-26 DIAGNOSIS — Z3202 Encounter for pregnancy test, result negative: Secondary | ICD-10-CM | POA: Insufficient documentation

## 2014-12-26 DIAGNOSIS — M25511 Pain in right shoulder: Secondary | ICD-10-CM | POA: Insufficient documentation

## 2014-12-26 LAB — COMPREHENSIVE METABOLIC PANEL
ALBUMIN: 4.3 g/dL (ref 3.5–5.0)
ALK PHOS: 69 U/L (ref 38–126)
ALT: 18 U/L (ref 14–54)
ANION GAP: 3 — AB (ref 5–15)
AST: 17 U/L (ref 15–41)
BILIRUBIN TOTAL: 0.3 mg/dL (ref 0.3–1.2)
BUN: 11 mg/dL (ref 6–20)
CALCIUM: 9.2 mg/dL (ref 8.9–10.3)
CO2: 25 mmol/L (ref 22–32)
CREATININE: 0.65 mg/dL (ref 0.44–1.00)
Chloride: 109 mmol/L (ref 101–111)
GFR calc Af Amer: >60 mL/min (ref >60)
GFR calc non Af Amer: >60 mL/min (ref >60)
GLUCOSE: 126 mg/dL — AB (ref 65–99)
Potassium: 4.2 mmol/L (ref 3.5–5.1)
Sodium: 137 mmol/L (ref 135–145)
TOTAL PROTEIN: 8 g/dL (ref 6.5–8.1)

## 2014-12-26 LAB — CK: Total CK: 57 U/L (ref 38–234)

## 2014-12-26 LAB — CBC
HCT: 37.2 % (ref 35.0–47.0)
Hemoglobin: 12.6 g/dL (ref 12.0–16.0)
MCH: 29.9 pg (ref 26.0–34.0)
MCHC: 34 g/dL (ref 32.0–36.0)
MCV: 88.1 fL (ref 80.0–100.0)
PLATELETS: 405 10*3/uL (ref 150–440)
RBC: 4.22 MIL/uL (ref 3.80–5.20)
RDW: 13.3 % (ref 11.5–14.5)
WBC: 8.3 10*3/uL (ref 3.6–11.0)

## 2014-12-26 LAB — URINALYSIS COMPLETE WITH MICROSCOPIC (ARMC ONLY)
Bilirubin Urine: NEGATIVE
Glucose, UA: NEGATIVE mg/dL
Hgb urine dipstick: NEGATIVE
KETONES UR: NEGATIVE mg/dL
Leukocytes, UA: NEGATIVE
NITRITE: NEGATIVE
PROTEIN: NEGATIVE mg/dL
SPECIFIC GRAVITY, URINE: 1.003 — AB (ref 1.005–1.030)
pH: 6 (ref 5.0–8.0)

## 2014-12-26 LAB — POCT PREGNANCY, URINE: Preg Test, Ur: NEGATIVE

## 2014-12-26 MED ORDER — OXYCODONE-ACETAMINOPHEN 5-325 MG PO TABS
1.0000 | ORAL_TABLET | Freq: Once | ORAL | Status: AC
  Administered 2014-12-26: 1 via ORAL
  Filled 2014-12-26: qty 1

## 2014-12-26 MED ORDER — KETOROLAC TROMETHAMINE 30 MG/ML IJ SOLN
30.0000 mg | Freq: Once | INTRAMUSCULAR | Status: AC
  Administered 2014-12-26: 30 mg via INTRAVENOUS
  Filled 2014-12-26: qty 1

## 2014-12-26 MED ORDER — DIAZEPAM 5 MG/ML IJ SOLN
1.0000 mg | Freq: Once | INTRAMUSCULAR | Status: AC
  Administered 2014-12-26: 1 mg via INTRAVENOUS
  Filled 2014-12-26: qty 2

## 2014-12-26 MED ORDER — SODIUM CHLORIDE 0.9 % IV BOLUS (SEPSIS)
1000.0000 mL | Freq: Once | INTRAVENOUS | Status: AC
  Administered 2014-12-26: 1000 mL via INTRAVENOUS

## 2014-12-26 MED ORDER — PROCHLORPERAZINE MALEATE 5 MG PO TABS: ORAL_TABLET | ORAL | Status: AC

## 2014-12-26 NOTE — ED Notes (Addendum)
Pt presents to ED with c/o generalize body ache mainly headache and upper back pain for about 3 weeks. Pt states she had dog bit and she has been getting rabies shots. Pt also reports diarrhea.

## 2014-12-26 NOTE — ED Provider Notes (Signed)
[facility] Emergency Department Provider Note  ____________________________________________  Time seen: Approximately 4:21 AM  I have reviewed the triage vital signs and the nursing notes.   HISTORY  Chief Complaint Generalized Body Aches and Diarrhea  History obtained via Spanish interpreter  HPI Jodi Hamilton is a 34 y.o. female who presents to the ED with complaints of generalized body aches after starting rabies shots approximately 3 weeks ago. Patient accompanied her son who was a patient in the ED and she decided to check in to be evaluated. States she has been receiving her rabies series secondary to dog bite. She is finished with her rabies series. Complains of generalized body aches maximally and head, neck and upper back and shoulders for approximately 3 weeks. Patient also reports diarrhea for the past several days. Denies associated fever, chills, chest pain, shortness of breath, abdominal pain, nausea, vomiting, dysuria. Does complain of generalized weakness. Denies recent travel or trauma. Nothing makes her symptoms better or worse.Patient saw her PCP who reassured her she was having side effects from the rabies vaccination. Patient is anxious and concerned that she has rabies. She denies any neurological symptoms such as numbness/tingling or muscle weakness.     There are no active problems to display for this patient.   History reviewed. No pertinent past surgical history.  Current Outpatient Rx  Name  Route  Sig  Dispense  Refill  . amoxicillin-clavulanate (AUGMENTIN) 875-125 MG tablet   Oral   Take 1 tablet by mouth 2 (two) times daily.   20 tablet   0   . omeprazole (PRILOSEC) 20 MG capsule   Oral   Take 1 capsule (20 mg total) by mouth daily.   30 capsule   0     Allergies Review of patient's allergies indicates no known allergies.  History reviewed. No pertinent family history.  Social History Social History   Substance Use Topics  . Smoking status: Never Smoker   . Smokeless tobacco: Never Used  . Alcohol Use: No    Review of Systems Constitutional: No fever/chills Eyes: No visual changes. ENT: No sore throat. Cardiovascular: Denies chest pain. Respiratory: Denies shortness of breath. Gastrointestinal: No abdominal pain.  No nausea, no vomiting.  No diarrhea.  No constipation. Genitourinary: Negative for dysuria. Musculoskeletal: Positive for body aches, neck pain and shoulder pain.  Skin: Negative for rash. Neurological: Positive for headaches. Negative for focal weakness or numbness. Of note, per patient's records she has been experiencing insomnia and anxiety since her dog bite.  10-point ROS otherwise negative.  ____________________________________________   PHYSICAL EXAM:  VITAL SIGNS: ED Triage Vitals  Enc Vitals Group     BP 12/26/14 0331 150/80 mmHg     Pulse Rate 12/26/14 0331 70     Resp 12/26/14 0331 18     Temp 12/26/14 0331 97.4 F (36.3 C)     Temp Source 12/26/14 0331 Oral     SpO2 12/26/14 0331 100 %     Weight --      Height --      Head Cir --      Peak Flow --      Pain Score 12/26/14 0331 7     Pain Loc --      Pain Edu? --      Excl. in GC? --     Constitutional: Alert and oriented. Well appearing and in no acute distress. Mildly anxious. Eyes: Conjunctivae are normal. PERRL. EOMI. Head: Atraumatic. Nose: No  congestion/rhinnorhea. Mouth/Throat: Mucous membranes are moist.  Oropharynx non-erythematous. Neck: No stridor.  No carotid bruits. Supple neck without evidence for meningismus. Hematological/Lymphatic/Immunilogical: No cervical lymphadenopathy. Cardiovascular: Normal rate, regular rhythm. Grossly normal heart sounds.  Good peripheral circulation. Respiratory: Normal respiratory effort.  No retractions. Lungs CTAB. Gastrointestinal: Soft and nontender. No distention. No abdominal bruits. No CVA tenderness. Musculoskeletal: No lower  extremity tenderness nor edema.  No joint effusions. Neurologic:  Normal speech and language. No gross focal neurologic deficits are appreciated. No gait instability. Skin:  Skin is warm, dry and intact. No rash noted. Psychiatric: Mood and affect are normal. Speech and behavior are normal.  ____________________________________________   LABS (all labs ordered are listed, but only abnormal results are displayed)  Labs Reviewed  COMPREHENSIVE METABOLIC PANEL - Abnormal; Notable for the following:    Glucose, Bld 126 (*)    Anion gap 3 (*)    All other components within normal limits  URINALYSIS COMPLETEWITH MICROSCOPIC ([facility] ONLY) - Abnormal; Notable for the following:    Color, Urine COLORLESS (*)    APPearance CLEAR (*)    Specific Gravity, Urine 1.003 (*)    Bacteria, UA RARE (*)    Squamous Epithelial / LPF 0-5 (*)    All other components within normal limits  CBC  CK  POC URINE PREG, ED  POCT PREGNANCY, URINE   ____________________________________________  EKG  None ____________________________________________  RADIOLOGY  None ____________________________________________   PROCEDURES  Procedure(s) performed: None  Critical Care performed: No  ____________________________________________   INITIAL IMPRESSION / ASSESSMENT AND PLAN / ED COURSE  Pertinent labs & imaging results that were available during my care of the patient were reviewed by me and considered in my medical decision making (see chart for details).  34 year old female who presents with a three-week history of myalgias, insomnia and anxiety since receiving rabies vaccination series s/p dog bite. No focal neurological deficits noted on exam. Reassured patient she does not have rabies. Will check screening lab work including CK, urinalysis, infuse IV fluids, IV Toradol and reassess.  ----------------------------------------- 6:40 AM on  12/26/2014 -----------------------------------------  Patient improved. Resting in no acute distress. Discussed with patient via Spanish interpreter results of her laboratory, urinalysis and CT testing. Will prescribe Compazine as needed for headache. Return precautions given. Patient verbalizes understanding and agrees with plan of care. ____________________________________________   FINAL CLINICAL IMPRESSION(S) / ED DIAGNOSES  Final diagnoses:  Body aches  Tension headache      Irean Hong, MD 12/26/14 [phone]

## 2014-12-26 NOTE — ED Notes (Signed)
Patient transported to CT 

## 2014-12-26 NOTE — ED Notes (Signed)
Pt returned from CT °

## 2014-12-26 NOTE — Discharge Instructions (Signed)
1. You may take headache medicine as needed (Compazine #15). 2. Return to the ER for worsening symptoms, persistent vomiting, fever, difficulty breathing or other concerns.

## 2016-04-27 ENCOUNTER — Emergency Department: Payer: No Typology Code available for payment source

## 2016-04-27 ENCOUNTER — Emergency Department
Admission: EM | Admit: 2016-04-27 | Discharge: 2016-04-27 | Disposition: A | Discharge status: Home / Self Care | Payer: No Typology Code available for payment source | Attending: Emergency Medicine | Admitting: Emergency Medicine

## 2016-04-27 ENCOUNTER — Encounter: Payer: Self-pay | Admitting: Emergency Medicine

## 2016-04-27 DIAGNOSIS — Y999 Unspecified external cause status: Secondary | ICD-10-CM | POA: Insufficient documentation

## 2016-04-27 DIAGNOSIS — S52692A Other fracture of lower end of left ulna, initial encounter for closed fracture: Secondary | ICD-10-CM | POA: Diagnosis not present

## 2016-04-27 DIAGNOSIS — S161XXA Strain of muscle, fascia and tendon at neck level, initial encounter: Secondary | ICD-10-CM | POA: Insufficient documentation

## 2016-04-27 DIAGNOSIS — S0990XA Unspecified injury of head, initial encounter: Secondary | ICD-10-CM | POA: Diagnosis present

## 2016-04-27 DIAGNOSIS — Y92410 Unspecified street and highway as the place of occurrence of the external cause: Secondary | ICD-10-CM | POA: Diagnosis not present

## 2016-04-27 DIAGNOSIS — S5292XA Unspecified fracture of left forearm, initial encounter for closed fracture: Secondary | ICD-10-CM

## 2016-04-27 DIAGNOSIS — S32009A Unspecified fracture of unspecified lumbar vertebra, initial encounter for closed fracture: Secondary | ICD-10-CM

## 2016-04-27 DIAGNOSIS — Y9389 Activity, other specified: Secondary | ICD-10-CM | POA: Diagnosis not present

## 2016-04-27 DIAGNOSIS — S32008A Other fracture of unspecified lumbar vertebra, initial encounter for closed fracture: Secondary | ICD-10-CM | POA: Diagnosis not present

## 2016-04-27 LAB — POCT PREGNANCY, URINE: Preg Test, Ur: NEGATIVE

## 2016-04-27 MED ORDER — ONDANSETRON 4 MG PO TBDP
ORAL_TABLET | ORAL | Status: AC
  Administered 2016-04-27: 4 mg via ORAL
  Filled 2016-04-27: qty 1

## 2016-04-27 MED ORDER — KETOROLAC TROMETHAMINE 60 MG/2ML IM SOLN
30.0000 mg | Freq: Once | INTRAMUSCULAR | Status: AC
  Administered 2016-04-27: 30 mg via INTRAMUSCULAR
  Filled 2016-04-27: qty 2

## 2016-04-27 MED ORDER — MECLIZINE HCL 25 MG PO TABS
25.0000 mg | ORAL_TABLET | Freq: Once | ORAL | Status: AC
  Administered 2016-04-27: 25 mg via ORAL
  Filled 2016-04-27: qty 1

## 2016-04-27 MED ORDER — CYCLOBENZAPRINE HCL 10 MG PO TABS: 10.0000 mg | ORAL_TABLET | Freq: Three times a day (TID) | ORAL | 0 refills | Status: AC | PRN

## 2016-04-27 MED ORDER — HYDROMORPHONE HCL 1 MG/ML IJ SOLN
1.0000 mg | Freq: Once | INTRAMUSCULAR | Status: AC
  Administered 2016-04-27: 1 mg via INTRAMUSCULAR
  Filled 2016-04-27: qty 1

## 2016-04-27 MED ORDER — ORPHENADRINE CITRATE 30 MG/ML IJ SOLN
60.0000 mg | Freq: Two times a day (BID) | INTRAMUSCULAR | Status: DC
  Administered 2016-04-27: 60 mg via INTRAMUSCULAR
  Filled 2016-04-27 (×2): qty 2

## 2016-04-27 MED ORDER — ONDANSETRON 4 MG PO TBDP
4.0000 mg | ORAL_TABLET | Freq: Once | ORAL | Status: AC
  Administered 2016-04-27: 4 mg via ORAL

## 2016-04-27 MED ORDER — OXYCODONE-ACETAMINOPHEN 7.5-325 MG PO TABS: 1.0000 | ORAL_TABLET | ORAL | 0 refills | Status: AC | PRN

## 2016-04-27 MED ORDER — IBUPROFEN 800 MG PO TABS: 800.0000 mg | ORAL_TABLET | Freq: Three times a day (TID) | ORAL | 0 refills | Status: DC | PRN
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
## 2016-04-27 NOTE — ED Notes (Signed)
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
## 2016-04-27 NOTE — ED Notes (Signed)
Pt reports continued dizziness and nausea. J. Cuthriell into room to see patient.

## 2016-04-27 NOTE — ED Notes (Signed)
Says pain left leg and left clavicle area.  Bruising noted on left knuckles .  Ring taken off and placed in her purse at her request.  Ice to left shoulder and hand

## 2016-04-27 NOTE — ED Notes (Signed)
Attempted to d/c patient, pt reports nausea, J. Cuthriell informed. Pt put back in bed, given ODT zofran and cold washcloth to forehead.

## 2016-06-13 ENCOUNTER — Encounter: Payer: Self-pay | Admitting: Radiology

## 2016-06-13 ENCOUNTER — Emergency Department: Payer: Self-pay | Admitting: Anesthesiology

## 2016-06-13 ENCOUNTER — Observation Stay
Admission: EM | Admit: 2016-06-13 | Discharge: 2016-06-14 | Disposition: A | Discharge status: Home / Self Care | Payer: Self-pay | Attending: Surgery | Admitting: Surgery

## 2016-06-13 ENCOUNTER — Emergency Department: Payer: Self-pay

## 2016-06-13 ENCOUNTER — Encounter
Admission: EM | Disposition: A | Discharge status: Home / Self Care | Payer: Self-pay | Source: Home / Self Care | Attending: Emergency Medicine

## 2016-06-13 DIAGNOSIS — Z6831 Body mass index (BMI) 31.0-31.9, adult: Secondary | ICD-10-CM | POA: Insufficient documentation

## 2016-06-13 DIAGNOSIS — Z79899 Other long term (current) drug therapy: Secondary | ICD-10-CM | POA: Insufficient documentation

## 2016-06-13 DIAGNOSIS — K353 Acute appendicitis with localized peritonitis, without perforation or gangrene: Secondary | ICD-10-CM

## 2016-06-13 DIAGNOSIS — K219 Gastro-esophageal reflux disease without esophagitis: Secondary | ICD-10-CM | POA: Insufficient documentation

## 2016-06-13 DIAGNOSIS — Z792 Long term (current) use of antibiotics: Secondary | ICD-10-CM | POA: Insufficient documentation

## 2016-06-13 DIAGNOSIS — E669 Obesity, unspecified: Secondary | ICD-10-CM | POA: Insufficient documentation

## 2016-06-13 DIAGNOSIS — K3580 Unspecified acute appendicitis: Secondary | ICD-10-CM | POA: Diagnosis present

## 2016-06-13 HISTORY — PX: LAPAROSCOPIC APPENDECTOMY: SHX408

## 2016-06-13 LAB — URINALYSIS, COMPLETE (UACMP) WITH MICROSCOPIC
Bilirubin Urine: NEGATIVE
GLUCOSE, UA: NEGATIVE mg/dL
Hgb urine dipstick: NEGATIVE
KETONES UR: NEGATIVE mg/dL
LEUKOCYTES UA: NEGATIVE
NITRITE: NEGATIVE
PH: 6 (ref 5.0–8.0)
Protein, ur: NEGATIVE mg/dL
SPECIFIC GRAVITY, URINE: 1.003 — AB (ref 1.005–1.030)
SQUAMOUS EPITHELIAL / LPF: NONE SEEN

## 2016-06-13 LAB — COMPREHENSIVE METABOLIC PANEL
ALBUMIN: 4 g/dL (ref 3.5–5.0)
ALK PHOS: 81 U/L (ref 38–126)
ALT: 50 U/L (ref 14–54)
AST: 43 U/L — AB (ref 15–41)
Anion gap: 7 (ref 5–15)
BILIRUBIN TOTAL: 0.5 mg/dL (ref 0.3–1.2)
BUN: 8 mg/dL (ref 6–20)
CO2: 24 mmol/L (ref 22–32)
CREATININE: 0.57 mg/dL (ref 0.44–1.00)
Calcium: 8.7 mg/dL — ABNORMAL LOW (ref 8.9–10.3)
Chloride: 105 mmol/L (ref 101–111)
GFR calc Af Amer: >60 mL/min (ref >60)
GLUCOSE: 117 mg/dL — AB (ref 65–99)
Potassium: 3.5 mmol/L (ref 3.5–5.1)
Sodium: 136 mmol/L (ref 135–145)
TOTAL PROTEIN: 7.7 g/dL (ref 6.5–8.1)

## 2016-06-13 LAB — CHLAMYDIA/NGC RT PCR (ARMC ONLY)
Chlamydia Tr: NOT DETECTED
N gonorrhoeae: NOT DETECTED

## 2016-06-13 LAB — CBC
HCT: 36 % (ref 35.0–47.0)
Hemoglobin: 12.4 g/dL (ref 12.0–16.0)
MCH: 30.2 pg (ref 26.0–34.0)
MCHC: 34.4 g/dL (ref 32.0–36.0)
MCV: 87.9 fL (ref 80.0–100.0)
PLATELETS: 378 10*3/uL (ref 150–440)
RBC: 4.1 MIL/uL (ref 3.80–5.20)
RDW: 13.4 % (ref 11.5–14.5)
WBC: 12.4 10*3/uL — AB (ref 3.6–11.0)

## 2016-06-13 LAB — POCT PREGNANCY, URINE: Preg Test, Ur: NEGATIVE

## 2016-06-13 LAB — WET PREP, GENITAL
Clue Cells Wet Prep HPF POC: NONE SEEN
SPERM: NONE SEEN
TRICH WET PREP: NONE SEEN
Yeast Wet Prep HPF POC: NONE SEEN

## 2016-06-13 SURGERY — APPENDECTOMY, LAPAROSCOPIC
Anesthesia: General | Wound class: Clean Contaminated

## 2016-06-13 MED ORDER — SODIUM CHLORIDE 0.9 % IV BOLUS (SEPSIS)
1000.0000 mL | Freq: Once | INTRAVENOUS | Status: AC
  Administered 2016-06-13: 1000 mL via INTRAVENOUS

## 2016-06-13 MED ORDER — ENOXAPARIN SODIUM 40 MG/0.4ML ~~LOC~~ SOLN
40.0000 mg | SUBCUTANEOUS | Status: DC
  Administered 2016-06-14: 40 mg via SUBCUTANEOUS
  Filled 2016-06-13: qty 0.4

## 2016-06-13 MED ORDER — ESMOLOL HCL 100 MG/10ML IV SOLN
INTRAVENOUS | Status: DC | PRN
  Administered 2016-06-13: 20 mg via INTRAVENOUS

## 2016-06-13 MED ORDER — ONDANSETRON HCL 4 MG/2ML IJ SOLN
INTRAMUSCULAR | Status: AC
  Filled 2016-06-13: qty 2

## 2016-06-13 MED ORDER — KETOROLAC TROMETHAMINE 30 MG/ML IJ SOLN
INTRAMUSCULAR | Status: DC | PRN
  Administered 2016-06-13: 30 mg via INTRAVENOUS

## 2016-06-13 MED ORDER — FENTANYL CITRATE (PF) 100 MCG/2ML IJ SOLN
INTRAMUSCULAR | Status: AC
  Filled 2016-06-13: qty 2

## 2016-06-13 MED ORDER — IOPAMIDOL (ISOVUE-300) INJECTION 61%
100.0000 mL | Freq: Once | INTRAVENOUS | Status: AC | PRN
  Administered 2016-06-13: 100 mL via INTRAVENOUS

## 2016-06-13 MED ORDER — ACETAMINOPHEN 10 MG/ML IV SOLN
INTRAVENOUS | Status: DC | PRN
  Administered 2016-06-13: 1000 mg via INTRAVENOUS

## 2016-06-13 MED ORDER — FENTANYL CITRATE (PF) 100 MCG/2ML IJ SOLN
INTRAMUSCULAR | Status: DC | PRN
  Administered 2016-06-13: 100 ug via INTRAVENOUS

## 2016-06-13 MED ORDER — METRONIDAZOLE IN NACL 5-0.79 MG/ML-% IV SOLN
500.0000 mg | Freq: Three times a day (TID) | INTRAVENOUS | Status: DC
  Administered 2016-06-13 – 2016-06-14 (×2): 500 mg via INTRAVENOUS
  Filled 2016-06-13 (×5): qty 100

## 2016-06-13 MED ORDER — PROPOFOL 10 MG/ML IV BOLUS
INTRAVENOUS | Status: DC | PRN
  Administered 2016-06-13: 160 mg via INTRAVENOUS

## 2016-06-13 MED ORDER — ONDANSETRON 4 MG PO TBDP: 4.0000 mg | ORAL_TABLET | Freq: Four times a day (QID) | ORAL | Status: DC | PRN

## 2016-06-13 MED ORDER — LIDOCAINE HCL (PF) 2 % IJ SOLN
INTRAMUSCULAR | Status: AC
  Filled 2016-06-13: qty 2

## 2016-06-13 MED ORDER — ROCURONIUM BROMIDE 50 MG/5ML IV SOLN
INTRAVENOUS | Status: AC
  Filled 2016-06-13: qty 1

## 2016-06-13 MED ORDER — PANTOPRAZOLE SODIUM 40 MG IV SOLR
40.0000 mg | Freq: Every day | INTRAVENOUS | Status: DC
  Administered 2016-06-13: 40 mg via INTRAVENOUS
  Filled 2016-06-13: qty 40

## 2016-06-13 MED ORDER — DEXAMETHASONE SODIUM PHOSPHATE 10 MG/ML IJ SOLN
INTRAMUSCULAR | Status: AC
  Filled 2016-06-13: qty 1

## 2016-06-13 MED ORDER — PROPOFOL 10 MG/ML IV BOLUS
INTRAVENOUS | Status: AC
  Filled 2016-06-13: qty 20

## 2016-06-13 MED ORDER — MIDAZOLAM HCL 2 MG/2ML IJ SOLN
INTRAMUSCULAR | Status: DC | PRN
  Administered 2016-06-13: 2 mg via INTRAVENOUS

## 2016-06-13 MED ORDER — ONDANSETRON HCL 4 MG/2ML IJ SOLN: 4.0000 mg | Freq: Four times a day (QID) | INTRAMUSCULAR | Status: DC | PRN

## 2016-06-13 MED ORDER — FENTANYL CITRATE (PF) 100 MCG/2ML IJ SOLN
25.0000 ug | INTRAMUSCULAR | Status: DC | PRN
  Administered 2016-06-13 (×3): 50 ug via INTRAVENOUS

## 2016-06-13 MED ORDER — KETOROLAC TROMETHAMINE 30 MG/ML IJ SOLN: 30.0000 mg | Freq: Four times a day (QID) | INTRAMUSCULAR | Status: DC | PRN

## 2016-06-13 MED ORDER — OXYCODONE HCL 5 MG PO TABS: 5.0000 mg | ORAL_TABLET | Freq: Once | ORAL | Status: DC | PRN

## 2016-06-13 MED ORDER — SUCCINYLCHOLINE CHLORIDE 20 MG/ML IJ SOLN
INTRAMUSCULAR | Status: AC
  Filled 2016-06-13: qty 1

## 2016-06-13 MED ORDER — SUGAMMADEX SODIUM 200 MG/2ML IV SOLN
INTRAVENOUS | Status: AC
  Filled 2016-06-13: qty 2

## 2016-06-13 MED ORDER — MORPHINE SULFATE (PF) 2 MG/ML IV SOLN: 2.0000 mg | INTRAVENOUS | Status: DC | PRN

## 2016-06-13 MED ORDER — MIDAZOLAM HCL 2 MG/2ML IJ SOLN
INTRAMUSCULAR | Status: AC
  Filled 2016-06-13: qty 2

## 2016-06-13 MED ORDER — PIPERACILLIN-TAZOBACTAM 3.375 G IVPB 30 MIN
3.3750 g | Freq: Once | INTRAVENOUS | Status: AC
  Administered 2016-06-13: 3.375 g via INTRAVENOUS
  Filled 2016-06-13: qty 50

## 2016-06-13 MED ORDER — DEXAMETHASONE SODIUM PHOSPHATE 10 MG/ML IJ SOLN
INTRAMUSCULAR | Status: DC | PRN
Start: 2016-06-13 — End: 2016-06-13
  Administered 2016-06-13: 10 mg via INTRAVENOUS

## 2016-06-13 MED ORDER — ACETAMINOPHEN 500 MG PO TABS
1000.0000 mg | ORAL_TABLET | Freq: Four times a day (QID) | ORAL | Status: DC
  Administered 2016-06-13 – 2016-06-14 (×2): 1000 mg via ORAL
  Filled 2016-06-13 (×2): qty 2

## 2016-06-13 MED ORDER — KETOROLAC TROMETHAMINE 30 MG/ML IJ SOLN
30.0000 mg | Freq: Four times a day (QID) | INTRAMUSCULAR | Status: DC
  Administered 2016-06-13 – 2016-06-14 (×2): 30 mg via INTRAVENOUS
  Filled 2016-06-13 (×2): qty 1

## 2016-06-13 MED ORDER — LACTATED RINGERS IV SOLN
INTRAVENOUS | Status: DC | PRN
  Administered 2016-06-13: 15:00:00 via INTRAVENOUS

## 2016-06-13 MED ORDER — SUGAMMADEX SODIUM 500 MG/5ML IV SOLN
INTRAVENOUS | Status: DC | PRN
  Administered 2016-06-13: 165.2 mg via INTRAVENOUS

## 2016-06-13 MED ORDER — ARTIFICIAL TEARS OPHTHALMIC OINT
TOPICAL_OINTMENT | OPHTHALMIC | Status: AC
  Filled 2016-06-13: qty 3.5

## 2016-06-13 MED ORDER — DEXTROSE 5 % IV SOLN
2.0000 g | INTRAVENOUS | Status: DC
  Administered 2016-06-13: 2 g via INTRAVENOUS
  Filled 2016-06-13 (×2): qty 2

## 2016-06-13 MED ORDER — OXYCODONE HCL 5 MG/5ML PO SOLN: 5.0000 mg | Freq: Once | ORAL | Status: DC | PRN

## 2016-06-13 MED ORDER — GLYCOPYRROLATE 0.2 MG/ML IJ SOLN
INTRAMUSCULAR | Status: AC
  Filled 2016-06-13: qty 1

## 2016-06-13 MED ORDER — LACTATED RINGERS IV SOLN
INTRAVENOUS | Status: DC
  Administered 2016-06-13: 18:00:00 via INTRAVENOUS

## 2016-06-13 MED ORDER — OXYCODONE HCL 5 MG PO TABS
5.0000 mg | ORAL_TABLET | ORAL | Status: DC | PRN
  Administered 2016-06-13: 5 mg via ORAL
  Filled 2016-06-13: qty 1

## 2016-06-13 MED ORDER — IOPAMIDOL (ISOVUE-300) INJECTION 61%
30.0000 mL | Freq: Once | INTRAVENOUS | Status: AC | PRN
  Administered 2016-06-13: 30 mL via ORAL

## 2016-06-13 MED ORDER — ROCURONIUM BROMIDE 100 MG/10ML IV SOLN
INTRAVENOUS | Status: DC | PRN
  Administered 2016-06-13: 30 mg via INTRAVENOUS

## 2016-06-13 MED ORDER — SUCCINYLCHOLINE CHLORIDE 20 MG/ML IJ SOLN
INTRAMUSCULAR | Status: DC | PRN
  Administered 2016-06-13: 100 mg via INTRAVENOUS

## 2016-06-13 SURGICAL SUPPLY — 36 items
APPLIER CLIP 5 13 M/L LIGAMAX5 (MISCELLANEOUS) ×3
BLADE CLIPPER SURG (BLADE) ×3 IMPLANT
CANISTER SUCT 1200ML W/VALVE (MISCELLANEOUS) ×3 IMPLANT
CHLORAPREP W/TINT 26ML (MISCELLANEOUS) ×3 IMPLANT
CLIP APPLIE 5 13 M/L LIGAMAX5 (MISCELLANEOUS) ×1 IMPLANT
CUTTER FLEX LINEAR 45M (STAPLE) ×3 IMPLANT
DERMABOND ADVANCED (GAUZE/BANDAGES/DRESSINGS) ×2
DERMABOND ADVANCED .7 DNX12 (GAUZE/BANDAGES/DRESSINGS) ×1 IMPLANT
ELECT CAUTERY BLADE 6.4 (BLADE) ×3 IMPLANT
ELECT REM PT RETURN 9FT ADLT (ELECTROSURGICAL) ×3
ELECTRODE REM PT RTRN 9FT ADLT (ELECTROSURGICAL) ×1 IMPLANT
ENDOPOUCH RETRIEVER 10 (MISCELLANEOUS) ×3 IMPLANT
GLOVE BIO SURGEON STRL SZ7 (GLOVE) ×9 IMPLANT
GOWN STRL REUS W/ TWL LRG LVL3 (GOWN DISPOSABLE) ×2 IMPLANT
GOWN STRL REUS W/TWL LRG LVL3 (GOWN DISPOSABLE) ×4
IRRIGATION STRYKERFLOW (MISCELLANEOUS) ×1 IMPLANT
IRRIGATOR STRYKERFLOW (MISCELLANEOUS) ×3
IV NS 1000ML (IV SOLUTION) ×2
IV NS 1000ML BAXH (IV SOLUTION) ×1 IMPLANT
NEEDLE HYPO 22GX1.5 SAFETY (NEEDLE) ×3 IMPLANT
NS IRRIG 500ML POUR BTL (IV SOLUTION) ×3 IMPLANT
PACK LAP CHOLECYSTECTOMY (MISCELLANEOUS) ×3 IMPLANT
PENCIL ELECTRO HAND CTR (MISCELLANEOUS) ×3 IMPLANT
RELOAD 45 VASCULAR/THIN (ENDOMECHANICALS) ×3 IMPLANT
RELOAD STAPLE TA45 3.5 REG BLU (ENDOMECHANICALS) ×6 IMPLANT
SCALPEL HARMONIC ACE (MISCELLANEOUS) ×3 IMPLANT
SCISSORS METZENBAUM CVD 33 (INSTRUMENTS) ×3 IMPLANT
SLEEVE ENDOPATH XCEL 5M (ENDOMECHANICALS) ×3 IMPLANT
SPONGE LAP 18X18 5 PK (GAUZE/BANDAGES/DRESSINGS) ×3 IMPLANT
SUT MNCRL AB 4-0 PS2 18 (SUTURE) ×3 IMPLANT
SUT VICRYL 0 AB UR-6 (SUTURE) ×6 IMPLANT
SYR 20CC LL (SYRINGE) ×3 IMPLANT
TRAY FOLEY W/METER SILVER 16FR (SET/KITS/TRAYS/PACK) IMPLANT
TROCAR XCEL BLUNT TIP 100MML (ENDOMECHANICALS) ×3 IMPLANT
TROCAR XCEL NON-BLD 5MMX100MML (ENDOMECHANICALS) ×6 IMPLANT
TUBING INSUF HEATED (TUBING) ×3 IMPLANT

## 2016-06-13 NOTE — ED Notes (Signed)
Jasmine December, RN from OR called to request pt. Pt remains stable at this time. Pt dressed in hospital gown with all jewelry off. Pts husband is reported to be on the way.

## 2016-06-13 NOTE — Op Note (Signed)
laparascopic appendectomy   [name] Date of operation:  06/13/2016  Indications: The patient presented with a history of  abdominal pain. Workup has revealed findings consistent with acute appendicitis.  Pre-operative Diagnosis: Acute appendicitis without mention of peritonitis  Post-operative Diagnosis: Same  Surgeon: Sterling Big, MD, FACS  Anesthesia: General with endotracheal tube  Findings: Acute nonperforated appendicitis  Estimated Blood Loss:  5cc         Specimens: appendix         Complications:  none  Procedure Details  The patient was seen again in the preop area. The options of surgery versus observation were reviewed with the patient and/or family. The risks of bleeding, infection, recurrence of symptoms, negative laparoscopy, potential for an open procedure, bowel injury, abscess or infection, were all reviewed as well. The patient was taken to Operating Room, identified as Jodi Hamilton and the procedure verified as laparoscopic appendectomy. A Time Out was held and the above information confirmed.  The patient was placed in the supine position and general anesthesia was induced.  Antibiotic prophylaxis was administered and VT E prophylaxis was in place. A Foley catheter was placed by the nursing staff.   The abdomen was prepped and draped in a sterile fashion. An infraumbilical incision was made. A cutdown technique was used to enter the abdominal cavity. Two vicryl stitches were placed on the fascia and a Hasson trocar inserted. Pneumoperitoneum obtained. Two 5 mm ports were placed under direct visualization.   The appendix was identified and found to be acutely inflamed  The appendix was carefully dissected. The mesoappendix was divided withHarmonic scalpel. The base of the appendix was dissected out and divided with a standard load Endo GIA.The appendix was placed in a Endo Catch bag and removed via the Hasson port. The right lower quadrant and  pelvis was then irrigated with  normal saline which was aspirated. Inspection  failed to identify any additional bleeding and there were no signs of bowel injury. Again the right lower quadrant was inspected there was no sign of bleeding or bowel injury therefore pneumoperitoneum was released, all ports were removed.  The umbilical fascia was closed with 0 Vicryl interrupted sutures and the skin incisions were approximated with subcuticular 4-0 Monocryl. Dermabond was placed The patient tolerated the procedure well, there were no complications. The sponge lap and needle count were correct at the end of the procedure.  The patient was taken to the recovery room in stable condition to be admitted for continued care.    Sterling Big, MD FACS

## 2016-06-13 NOTE — Transfer of Care (Signed)
Immediate Anesthesia Transfer of Care Note  Patient: Jodi Hamilton  Procedure(s) Performed: Procedure(s): APPENDECTOMY LAPAROSCOPIC (N/A)  Patient Location: PACU  Anesthesia Type:General  Level of Consciousness: awake, alert  and oriented  Airway & Oxygen Therapy: Patient connected to face mask oxygen  Post-op Assessment: Report given to RN and Post -op Vital signs reviewed and stable  Post vital signs: Reviewed and stable  Last Vitals:  Vitals:   06/13/16 1612 06/13/16 1621  BP: 120/66   Pulse: 92 85  Resp: 11 19  Temp: 37 C     Last Pain:  Vitals:   06/13/16 0637  TempSrc: Oral  PainSc: 7          Complications: No apparent anesthesia complications

## 2016-06-13 NOTE — ED Notes (Signed)
Pt visualized in NAD at this time. Awaiting surgical consent. Skin warm, dry, and intact, respirations even and unlabored, mucous membranes moist and intact. Will continue to monitor for further patient needs.

## 2016-06-13 NOTE — Anesthesia Preprocedure Evaluation (Signed)
Anesthesia Evaluation  Patient identified by MRN, date of birth, ID band Patient awake    Reviewed: Allergy & Precautions, H&P , NPO status , Patient's Chart, lab work & pertinent test results  Airway Mallampati: III  TM Distance: >3 FB Neck ROM: full    Dental  (+) Poor Dentition, Chipped, Caps   Pulmonary neg pulmonary ROS, neg shortness of breath,    Pulmonary exam normal breath sounds clear to auscultation       Cardiovascular Exercise Tolerance: Good (-) angina(-) Past MI and (-) DOE negative cardio ROS Normal cardiovascular exam Rhythm:regular Rate:Normal     Neuro/Psych negative neurological ROS  negative psych ROS   GI/Hepatic Neg liver ROS, GERD  Controlled,  Endo/Other  negative endocrine ROS  Renal/GU      Musculoskeletal   Abdominal   Peds  Hematology negative hematology ROS (+)   Anesthesia Other Findings Signs and symptoms suggestive of sleep apnea   History reviewed. No pertinent surgical history.  BMI    Body Mass Index:  31.24 kg/m      Reproductive/Obstetrics negative OB ROS                             Anesthesia Physical Anesthesia Plan  ASA: III  Anesthesia Plan: General ETT   Post-op Pain Management:    Induction: Intravenous  Airway Management Planned: Oral ETT  Additional Equipment:   Intra-op Plan:   Post-operative Plan: Extubation in OR  Informed Consent: I have reviewed the patients History and Physical, chart, labs and discussed the procedure including the risks, benefits and alternatives for the proposed anesthesia with the patient or authorized representative who has indicated his/her understanding and acceptance.   Dental Advisory Given  Plan Discussed with: Anesthesiologist, CRNA and Surgeon  Anesthesia Plan Comments: (Patient consented for risks of anesthesia including but not limited to:  - adverse reactions to medications - damage  to teeth, lips or other oral mucosa - sore throat or hoarseness - Damage to heart, brain, lungs or loss of life  Patient voiced understanding.)        Anesthesia Quick Evaluation

## 2016-06-13 NOTE — ED Triage Notes (Signed)
Pt states right lower quadrant pain that radiates to right lower back since this am. Pt states she also has urinary frequency and dysuria. Pt denies known hematuria. Pt denies vomiting, fever, diarrhea or nausea. Pt states "this morning when it started I thought I was dying, but it's better now."

## 2016-06-13 NOTE — ED Notes (Signed)
This RN to bedside, pt changing at this time. Pelvic cart to bedside at this time.

## 2016-06-13 NOTE — H&P (Signed)
Patient ID: Jodi Hamilton, female   DOB: 10/16/1980, 35 y.o.   MRN: [id number]  HPI Jodi Hamilton is a 36 y.o. female   HPI 36 yo female w abdominal pain that started yesterday. Initially periumbilical area and now localized to RLQ, pain is moderate in intensity and worsening w movement. Pain now is sharp.  Some nausea and decrease appetite. No emesis. Some chills but no fevers.   Good CV performance , no previous abd operations. CT p. Reviewed inflammation periappendix, no abscess, no free air or perforation. Increase wbc , nml creat.   History reviewed. No pertinent past medical history.  No past surgical history on file.  No family history on file.  Social History Social History  Substance Use Topics  . Smoking status: Never Smoker  . Smokeless tobacco: Never Used  . Alcohol use No    No Known Allergies  Current Facility-Administered Medications  Medication Dose Route Frequency Provider Last Rate Last Dose  . piperacillin-tazobactam (ZOSYN) IVPB 3.375 g  3.375 g Intravenous Once Governor Rooks, MD      . sodium chloride 0.9 % bolus 1,000 mL  1,000 mL Intravenous Once Governor Rooks, MD       Current Outpatient Prescriptions  Medication Sig Dispense Refill  . cyclobenzaprine (FLEXERIL) 10 MG tablet Take 1 tablet (10 mg total) by mouth 3 (three) times daily as needed. (Patient not taking: Reported on 06/13/2016) 15 tablet 0  . ibuprofen (ADVIL,MOTRIN) 800 MG tablet Take 1 tablet (800 mg total) by mouth every 8 (eight) hours as needed. (Patient not taking: Reported on 06/13/2016) 30 tablet 0  . omeprazole (PRILOSEC) 20 MG capsule Take 1 capsule (20 mg total) by mouth daily. (Patient not taking: Reported on 06/13/2016) 30 capsule 0  . oxyCODONE-acetaminophen (PERCOCET) 7.5-325 MG tablet Take 1 tablet by mouth every 4 (four) hours as needed for severe pain. (Patient not taking: Reported on 06/13/2016) 20 tablet 0  . prochlorperazine (COMPAZINE) 5 MG tablet 1 tablet every 4-6  hours as needed for headache. (Patient not taking: Reported on 06/13/2016) 15 tablet 0     Review of Systems Full ROS  was asked and was negative except for the information on the HPI  Physical Exam Blood pressure 121/79, pulse 70, temperature 97.7 F (36.5 C), temperature source Oral, resp. rate 16, height  (1.626 m), weight 82.6 kg (182 lb), last menstrual period 05/31/2016, SpO2 100 %. CONSTITUTIONAL: NAD, non toxic EYES: Pupils are equal, round, and reactive to light, Sclera are non-icteric. EARS, NOSE, MOUTH AND THROAT: The oropharynx is clear. The oral mucosa is pink and moist. Hearing is intact to voice. LYMPH NODES:  Lymph nodes in the neck are normal. RESPIRATORY:  Lungs are clear. There is normal respiratory effort, with equal breath sounds bilaterally, and without pathologic use of accessory muscles. CARDIOVASCULAR: Heart is regular without murmurs, gallops, or rubs. GI: The abdomen is  soft, + Mcburney sign. w focal peritonitis,  GU: Rectal deferred.   MUSCULOSKELETAL: Normal muscle strength and tone. No cyanosis or edema.   SKIN: Turgor is good and there are no pathologic skin lesions or ulcers. NEUROLOGIC: Motor and sensation is grossly normal. Cranial nerves are grossly intact. PSYCH:  Oriented to person, place and time. Affect is normal.  Data Reviewed I have personally reviewed the patient's imaging, laboratory findings and medical records.    Assessment Plan RLQ pain with classic signs and sxs c/w acute appendicitis confirmed by CT scan. D/W the pt about her disease  and I do recommend appendectomy. The risks, benefits, complications, treatment options, and expected outcomes were discussed with the patient. The treatment of antibiotics alone was discussed giving a 20% chance that this could fail and surgery would be necessary.  Also discussed continuing to the operating room for Laparoscopic Appendectomy.  The possibilities of  bleeding, recurrent infection,  perforation of viscus, finding a normal appendix, the need for additional procedures, failure to diagnose a condition, conversion to open procedure and creating a complication requiring transfusion or further operations were discussed. The patient was given the opportunity to ask questions and have them answered.  Patient would like to proceed with Laparoscopic Appendectomy and consent was obtained.  We will start A/bs and fluids  Sterling Big, MD FACS General Surgeon 06/13/2016, 11:03 AM

## 2016-06-13 NOTE — Anesthesia Postprocedure Evaluation (Signed)
Anesthesia Post Note  Patient: Jodi Hamilton  Procedure(s) Performed: Procedure(s) (LRB): APPENDECTOMY LAPAROSCOPIC (N/A)  Patient location during evaluation: PACU Anesthesia Type: General Level of consciousness: awake and alert Pain management: pain level controlled Vital Signs Assessment: post-procedure vital signs reviewed and stable Respiratory status: spontaneous breathing, nonlabored ventilation, respiratory function stable and patient connected to nasal cannula oxygen Cardiovascular status: blood pressure returned to baseline and stable Postop Assessment: no signs of nausea or vomiting Anesthetic complications: no     Last Vitals:  Vitals:   06/13/16 1708 06/13/16 1725  BP:  118/63  Pulse: 72 67  Resp: 14   Temp:  36.8 C    Last Pain:  Vitals:   06/13/16 1727  TempSrc:   PainSc: Asleep                 Cleda Mccreedy Mairead Schwarzkopf

## 2016-06-13 NOTE — ED Notes (Signed)
Consent obtained with Dr. Everlene Farrier at bedside, placed on chart at this time.

## 2016-06-13 NOTE — Anesthesia Post-op Follow-up Note (Cosign Needed)
Anesthesia QCDR form completed.        

## 2016-06-13 NOTE — ED Notes (Signed)
Pelvic exam performed by this MD, this RN present for witness, samples collected and sent to lab for analysis. Pt then up to the bathroom to give urine specimen.

## 2016-06-13 NOTE — Anesthesia Procedure Notes (Signed)
Procedure Name: Intubation Date/Time: 06/13/2016 3:07 PM Performed by: Jennette Bill Pre-anesthesia Checklist: Patient identified, Emergency Drugs available, Suction available, Patient being monitored and Timeout performed Patient Re-evaluated:Patient Re-evaluated prior to inductionOxygen Delivery Method: Circle system utilized Preoxygenation: Pre-oxygenation with 100% oxygen Intubation Type: IV induction Ventilation: Mask ventilation without difficulty Laryngoscope Size: 3 and Mac Grade View: Grade II Tube type: Oral Tube size: 7.0 mm Number of attempts: 1 Airway Equipment and Method: Stylet Placement Confirmation: ETT inserted through vocal cords under direct vision,  positive ETCO2 and breath sounds checked- equal and bilateral Secured at: 22 cm Tube secured with: Tape Dental Injury: Teeth and Oropharynx as per pre-operative assessment

## 2016-06-13 NOTE — ED Provider Notes (Signed)
[facility] Emergency Department Provider Note ____________________________________________   I have reviewed the triage vital signs and the triage nursing note.  HISTORY  Chief Complaint Abdominal Pain   Historian Patient and significant other, through Spanish interpreter  HPI Jodi Hamilton is a 36 y.o. female presenting with mid abdominal pain that started yesterday evening and then throughout the night progressed down into the right lower quadrant and a little bit suprapubic. She's had some mild urinary frequency without hematuria. She's had some extension of the pain into her back. At one point the pain got very severe and then has eased off some since she's been emergent department. At worst pain was severe. Currently 4 out of 10. Mild nausea with no vomiting. No diarrhea or constipation. Denies any unusual vaginal bleeding or vaginal discharge. Nothing seems to make it worse or better.    History reviewed. No pertinent past medical history.  There are no active problems to display for this patient.   No past surgical history on file.  Prior to Admission medications   Medication Sig Start Date End Date Taking? Authorizing Provider  amoxicillin-clavulanate (AUGMENTIN) 875-125 MG tablet Take 1 tablet by mouth 2 (two) times daily. 11/29/14   Chinita Pester, FNP  cyclobenzaprine (FLEXERIL) 10 MG tablet Take 1 tablet (10 mg total) by mouth 3 (three) times daily as needed. 04/27/16   Joni Reining, PA-C  ibuprofen (ADVIL,MOTRIN) 800 MG tablet Take 1 tablet (800 mg total) by mouth every 8 (eight) hours as needed. 04/27/16   Joni Reining, PA-C  omeprazole (PRILOSEC) 20 MG capsule Take 1 capsule (20 mg total) by mouth daily. 11/26/14 11/26/15  Gayla Doss, MD  oxyCODONE-acetaminophen (PERCOCET) 7.5-325 MG tablet Take 1 tablet by mouth every 4 (four) hours as needed for severe pain. 04/27/16 04/27/17  Joni Reining, PA-C  prochlorperazine (COMPAZINE) 5 MG  tablet 1 tablet every 4-6 hours as needed for headache. 12/26/14   Irean Hong, MD    No Known Allergies  No family history on file.  Social History Social History  Substance Use Topics  . Smoking status: Never Smoker  . Smokeless tobacco: Never Used  . Alcohol use No    Review of Systems  Constitutional: Negative for fever. Eyes: Negative for visual changes. ENT: Negative for sore throat. Cardiovascular: Negative for chest pain. Respiratory: Negative for shortness of breath. Gastrointestinal: Negative for vomiting and diarrhea. Genitourinary: Positive for mild dysuria. Musculoskeletal: Negative for back pain. Skin: Negative for rash. Neurological: Negative for headache. 10 point Review of Systems otherwise negative ____________________________________________   PHYSICAL EXAM:  VITAL SIGNS: ED Triage Vitals  Enc Vitals Group     BP 06/13/16 0249 (!) 136/91     Pulse Rate 06/13/16 0249 75     Resp 06/13/16 0249 16     Temp 06/13/16 0249 98.9 F (37.2 C)     Temp Source 06/13/16 0249 Oral     SpO2 06/13/16 0249 98 %     Weight 06/13/16 0249 182 lb (82.6 kg)     Height 06/13/16 0249  (1.626 m)     Head Circumference --      Peak Flow --      Pain Score 06/13/16 0248 5     Pain Loc --      Pain Edu? --      Excl. in GC? --      Constitutional: Alert and oriented. Well appearing and in no distress. HEENT  Head: Normocephalic and atraumatic.      Eyes: Conjunctivae are normal. PERRL. Normal extraocular movements.      Ears:         Nose: No congestion/rhinnorhea.   Mouth/Throat: Mucous membranes are moist.   Neck: No stridor. Cardiovascular/Chest: Normal rate, regular rhythm.  No murmurs, rubs, or gallops. Respiratory: Normal respiratory effort without tachypnea nor retractions. Breath sounds are clear and equal bilaterally. No wheezes/rales/rhonchi. Gastrointestinal: Soft. No distention, no guarding, no rebound. Obese. Tender rlq moderate   Genitourinary/rectal:No vaginal discharge. No cervicitis. Musculoskeletal: Nontender with normal range of motion in all extremities. No joint effusions.  No lower extremity tenderness.  No edema. Neurologic:  Normal speech and language. No gross or focal neurologic deficits are appreciated. Skin:  Skin is warm, dry and intact. No rash noted. Psychiatric: Mood and affect are normal. Speech and behavior are normal. Patient exhibits appropriate insight and judgment.   ____________________________________________  LABS (pertinent positives/negatives)  Labs Reviewed  WET PREP, GENITAL - Abnormal; Notable for the following:       Result Value   WBC, Wet Prep HPF POC MODERATE (*)    All other components within normal limits  COMPREHENSIVE METABOLIC PANEL - Abnormal; Notable for the following:    Glucose, Bld 117 (*)    Calcium 8.7 (*)    AST 43 (*)    All other components within normal limits  CBC - Abnormal; Notable for the following:    WBC 12.4 (*)    All other components within normal limits  URINALYSIS, COMPLETE (UACMP) WITH MICROSCOPIC - Abnormal; Notable for the following:    Color, Urine STRAW (*)    APPearance CLEAR (*)    Specific Gravity, Urine 1.003 (*)    Bacteria, UA RARE (*)    All other components within normal limits  CHLAMYDIA/NGC RT PCR ([facility] ONLY)  URINALYSIS, COMPLETE (UACMP) WITH MICROSCOPIC  POC URINE PREG, ED  POCT PREGNANCY, URINE    ____________________________________________    EKG I, Governor Rooks, MD, the attending physician have personally viewed and interpreted all ECGs.  None ____________________________________________  RADIOLOGY All Xrays were viewed by me. Imaging interpreted by Radiologist.  CT abd pel w:  IMPRESSION: 1. Findings consistent with acute appendicitis.  __________________________________________  PROCEDURES  Procedure(s) performed: None  Critical Care performed:  None  ____________________________________________   ED COURSE / ASSESSMENT AND PLAN  Pertinent labs & imaging results that were available during my care of the patient were reviewed by me and considered in my medical decision making (see chart for details).   Symptoms are somewhat concerning for possible appendicitis with location of the mid abdomen now gone into the right lower quadrant. I discussed this with her and will proceed with CT imaging. Also consider ovarian pathology.   CT consistent with acute appendicitis. I updated the patient was managed her. I consulted general surgeon Dr. Everlene Farrier, who will see the patient in the emergency room.    CONSULTATIONS:   Gen Surg, Dr. Everlene Farrier - recommends IVF and zosyn.   Patient / Family / Caregiver informed of clinical course, medical decision-making process, and agree with plan.    ___________________________________________   FINAL CLINICAL IMPRESSION(S) / ED DIAGNOSES   Final diagnoses:  Acute appendicitis with localized peritonitis              Note: This dictation was prepared with Dragon dictation. Any transcriptional errors that result from this process are unintentional    Governor Rooks, MD 06/13/16 1051

## 2016-06-14 ENCOUNTER — Encounter: Payer: Self-pay | Admitting: Surgery

## 2016-06-14 MED ORDER — HYDROCODONE-ACETAMINOPHEN 5-325 MG PO TABS: 1.0000 | ORAL_TABLET | Freq: Four times a day (QID) | ORAL | 0 refills | Status: AC | PRN

## 2016-06-14 NOTE — Discharge Summary (Signed)
Patient ID: Jodi Hamilton MRN: [id number] DOB/AGE: Mar 04, 1980 36 y.o.  Admit date: 06/13/2016 Discharge date: 06/14/2016   Discharge Diagnoses:  Active Problems:   Appendicitis, acute   Procedures: Laparoscopic cholecystectomy  Hospital Course: 36 year old female with classic signs and symptoms consistent with acute appendicitis confirmed by CT scan. She was promptly taken to the operative room for an uneventful laparoscopic appendectomy. She was kept overnight and the time of discharge she was ambulating, tolerating a regular diet and her pain was controlled. Her physical exam at discharge showed a female in no acute distress, awake and alert. Abdomen: Incision is healing well no evidence of infection. No peritonitis. Extremities: No edema and well perfused. Condition at discharge stable    Disposition: 01-Home or Self Care  Discharge Instructions    Call MD for:  difficulty breathing, headache or visual disturbances    Complete by:  As directed    Call MD for:  extreme fatigue    Complete by:  As directed    Call MD for:  hives    Complete by:  As directed    Call MD for:  persistant dizziness or light-headedness    Complete by:  As directed    Call MD for:  persistant nausea and vomiting    Complete by:  As directed    Call MD for:  redness, tenderness, or signs of infection (pain, swelling, redness, odor or green/yellow discharge around incision site)    Complete by:  As directed    Call MD for:  severe uncontrolled pain    Complete by:  As directed    Call MD for:  temperature >100.4    Complete by:  As directed    Diet - low sodium heart healthy    Complete by:  As directed    Discharge instructions    Complete by:  As directed    Shower tomorrow, please provide pt with work excuse until F/U appt. ( 10 days)   Increase activity slowly    Complete by:  As directed    Lifting restrictions    Complete by:  As directed    20 lbs x 6 wks   No wound care     Complete by:  As directed      Allergies as of 06/14/2016   No Known Allergies     Medication List    STOP taking these medications   ibuprofen 200 MG tablet Commonly known as:  ADVIL,MOTRIN   ibuprofen 800 MG tablet Commonly known as:  ADVIL,MOTRIN     TAKE these medications   cyclobenzaprine 10 MG tablet Commonly known as:  FLEXERIL Take 1 tablet (10 mg total) by mouth 3 (three) times daily as needed.   HYDROcodone-acetaminophen 5-325 MG tablet Commonly known as:  NORCO/VICODIN Take 1-2 tablets by mouth every 6 (six) hours as needed for moderate pain.   multivitamin tablet Take 1 tablet by mouth daily.   omeprazole 20 MG capsule Commonly known as:  PRILOSEC Take 1 capsule (20 mg total) by mouth daily.   oxyCODONE-acetaminophen 7.5-325 MG tablet Commonly known as:  PERCOCET Take 1 tablet by mouth every 4 (four) hours as needed for severe pain.   prochlorperazine 5 MG tablet Commonly known as:  COMPAZINE 1 tablet every 4-6 hours as needed for headache.      Follow-up Information    Leafy Ro, MD Follow up in 2 week(s).   Specialty:  General Surgery Contact information: [phone]  [place] [address] [phone]            Sterling Big, MD FACS

## 2016-06-16 LAB — SURGICAL PATHOLOGY

## 2016-06-16 LAB — HIV ANTIBODY (ROUTINE TESTING W REFLEX): HIV SCREEN 4TH GENERATION: NONREACTIVE

## 2016-06-24 DIAGNOSIS — K219 Gastro-esophageal reflux disease without esophagitis: Secondary | ICD-10-CM

## 2016-06-25 ENCOUNTER — Encounter: Payer: Self-pay | Admitting: Surgery

## 2016-07-04 DIAGNOSIS — N390 Urinary tract infection, site not specified: Secondary | ICD-10-CM | POA: Insufficient documentation

## 2016-07-04 DIAGNOSIS — K5900 Constipation, unspecified: Secondary | ICD-10-CM | POA: Insufficient documentation

## 2016-07-04 DIAGNOSIS — Z79899 Other long term (current) drug therapy: Secondary | ICD-10-CM | POA: Insufficient documentation

## 2016-07-04 NOTE — ED Triage Notes (Signed)
Patient reports mid right abdominal pain for the past 3-4 days, worse tonight.  Patient reports today with burning sensation in chest and feeling dizzy.

## 2016-07-05 ENCOUNTER — Emergency Department: Payer: Self-pay

## 2016-07-05 ENCOUNTER — Emergency Department
Admission: EM | Admit: 2016-07-05 | Discharge: 2016-07-05 | Disposition: A | Discharge status: Home / Self Care | Payer: Self-pay | Attending: Emergency Medicine | Admitting: Emergency Medicine

## 2016-07-05 DIAGNOSIS — K5900 Constipation, unspecified: Secondary | ICD-10-CM

## 2016-07-05 DIAGNOSIS — R1011 Right upper quadrant pain: Secondary | ICD-10-CM

## 2016-07-05 DIAGNOSIS — N390 Urinary tract infection, site not specified: Secondary | ICD-10-CM

## 2016-07-05 LAB — URINALYSIS, COMPLETE (UACMP) WITH MICROSCOPIC
BILIRUBIN URINE: NEGATIVE
Bacteria, UA: NONE SEEN
GLUCOSE, UA: NEGATIVE mg/dL
Ketones, ur: NEGATIVE mg/dL
NITRITE: NEGATIVE
Protein, ur: 30 mg/dL — AB
SPECIFIC GRAVITY, URINE: 1.02 (ref 1.005–1.030)
pH: 6 (ref 5.0–8.0)

## 2016-07-05 LAB — COMPREHENSIVE METABOLIC PANEL
ALT: 22 U/L (ref 14–54)
AST: 22 U/L (ref 15–41)
Albumin: 3.9 g/dL (ref 3.5–5.0)
Alkaline Phosphatase: 71 U/L (ref 38–126)
Anion gap: 9 (ref 5–15)
BILIRUBIN TOTAL: 0.3 mg/dL (ref 0.3–1.2)
BUN: 10 mg/dL (ref 6–20)
CALCIUM: 9.4 mg/dL (ref 8.9–10.3)
CO2: 25 mmol/L (ref 22–32)
CREATININE: 0.64 mg/dL (ref 0.44–1.00)
Chloride: 104 mmol/L (ref 101–111)
GFR calc Af Amer: >60 mL/min (ref >60)
Glucose, Bld: 112 mg/dL — ABNORMAL HIGH (ref 65–99)
POTASSIUM: 3.5 mmol/L (ref 3.5–5.1)
Sodium: 138 mmol/L (ref 135–145)
TOTAL PROTEIN: 8.1 g/dL (ref 6.5–8.1)

## 2016-07-05 LAB — CBC
HEMATOCRIT: 36.6 % (ref 35.0–47.0)
Hemoglobin: 12.6 g/dL (ref 12.0–16.0)
MCH: 29.7 pg (ref 26.0–34.0)
MCHC: 34.3 g/dL (ref 32.0–36.0)
MCV: 86.7 fL (ref 80.0–100.0)
PLATELETS: 421 10*3/uL (ref 150–440)
RBC: 4.22 MIL/uL (ref 3.80–5.20)
RDW: 13.5 % (ref 11.5–14.5)
WBC: 8.7 10*3/uL (ref 3.6–11.0)

## 2016-07-05 LAB — LIPASE, BLOOD: LIPASE: 16 U/L (ref 11–51)

## 2016-07-05 LAB — TROPONIN I: Troponin I: <0.03 ng/mL (ref <0.03)

## 2016-07-05 MED ORDER — IOPAMIDOL (ISOVUE-300) INJECTION 61%
100.0000 mL | Freq: Once | INTRAVENOUS | Status: AC | PRN
  Administered 2016-07-05: 100 mL via INTRAVENOUS

## 2016-07-05 MED ORDER — LACTULOSE 10 GM/15ML PO SOLN: 20.0000 g | Freq: Every day | ORAL | 0 refills | Status: AC | PRN

## 2016-07-05 MED ORDER — SODIUM CHLORIDE 0.9 % IV BOLUS (SEPSIS)
1000.0000 mL | Freq: Once | INTRAVENOUS | Status: AC
  Administered 2016-07-05: 1000 mL via INTRAVENOUS

## 2016-07-05 MED ORDER — MORPHINE SULFATE (PF) 2 MG/ML IV SOLN
2.0000 mg | Freq: Once | INTRAVENOUS | Status: AC
  Administered 2016-07-05: 2 mg via INTRAVENOUS
  Filled 2016-07-05: qty 1

## 2016-07-05 MED ORDER — ONDANSETRON HCL 4 MG/2ML IJ SOLN
4.0000 mg | Freq: Once | INTRAMUSCULAR | Status: AC
  Administered 2016-07-05: 4 mg via INTRAVENOUS
  Filled 2016-07-05: qty 2

## 2016-07-05 MED ORDER — SULFAMETHOXAZOLE-TRIMETHOPRIM 800-160 MG PO TABS: 1.0000 | ORAL_TABLET | Freq: Two times a day (BID) | ORAL | 0 refills | Status: AC

## 2016-07-05 MED ORDER — IOPAMIDOL (ISOVUE-300) INJECTION 61%
15.0000 mL | INTRAVENOUS | Status: AC
Start: 2016-07-05 — End: 2016-07-05
  Administered 2016-07-05: 15 mL via ORAL

## 2016-07-05 NOTE — Discharge Instructions (Signed)
1. You may take Lactulose as needed for bowel movements. 2. Take antibiotic as prescribed (Septra DS twice daily x 3 days). 3. Return to the ER for worsening symptoms, persistent vomiting, difficulty breathing or other concerns.

## 2016-07-05 NOTE — ED Provider Notes (Signed)
[facility] Emergency Department Provider Note   ____________________________________________   First MD Initiated Contact with Patient 07/05/16 0234     (approximate)  I have reviewed the triage vital signs and the nursing notes.   HISTORY  Chief Complaint Abdominal Pain    HPI Jodi Hamilton is a 36 y.o. female who presents to the ED from home with a chief complain of abdominal pain. Patient had laparoscopic appendectomy approximately 1 month ago. Complains of a 3-4 day history of mid abdominal and right sided pain, worse tonight. Reports today pain is associated with a burning sensation in her chest and feeling dizzy.Denies fever, chills, shortness of breath, nausea, vomiting, dysuria, diarrhea. Denies recent travel, trauma or antibiotic use. Nothing makes her pain better. Food makes her symptoms worse.   Past Medical History:  Diagnosis Date  . GERD (gastroesophageal reflux disease)     Patient Active Problem List   Diagnosis Date Noted  . GERD (gastroesophageal reflux disease)   . Appendicitis, acute 06/13/2016  . Acute appendicitis with localized peritonitis     Past Surgical History:  Procedure Laterality Date  . LAPAROSCOPIC APPENDECTOMY N/A 06/13/2016   Procedure: APPENDECTOMY LAPAROSCOPIC;  Surgeon: Leafy Roiego F Pabon, MD;  Location: [facility] ORS;  Service: General;  Laterality: N/A;    Prior to Admission medications   Medication Sig Start Date End Date Taking? Authorizing Provider  cyclobenzaprine (FLEXERIL) 10 MG tablet Take 1 tablet (10 mg total) by mouth 3 (three) times daily as needed. Patient not taking: Reported on 06/13/2016 04/27/16   Joni ReiningSmith, Ronald K, PA-C  HYDROcodone-acetaminophen (NORCO/VICODIN) 5-325 MG tablet Take 1-2 tablets by mouth every 6 (six) hours as needed for moderate pain. 06/14/16   Pabon, Diego F, MD  ibuprofen (ADVIL,MOTRIN) 200 MG tablet Take 200 mg by mouth every 6 (six) hours as needed.    [provider]   Multiple Vitamin (MULTIVITAMIN) tablet Take 1 tablet by mouth daily.    [provider]  omeprazole (PRILOSEC) 20 MG capsule Take 1 capsule (20 mg total) by mouth daily. Patient not taking: Reported on 06/13/2016 11/26/14 11/26/15  Gayla DossGayle, Eryka A, MD  oxyCODONE-acetaminophen (PERCOCET) 7.5-325 MG tablet Take 1 tablet by mouth every 4 (four) hours as needed for severe pain. Patient not taking: Reported on 06/13/2016 04/27/16 04/27/17  Joni ReiningSmith, Ronald K, PA-C  prochlorperazine (COMPAZINE) 5 MG tablet 1 tablet every 4-6 hours as needed for headache. Patient not taking: Reported on 06/13/2016 12/26/14   Irean HongSung, Aurelie Dicenzo J, MD  tiZANidine (ZANAFLEX) 4 MG tablet Take 1 tablet by mouth at bedtime as needed. 05/12/16   [provider]    Allergies Patient has no known allergies.  No family history on file.  Social History Social History  Substance Use Topics  . Smoking status: Never Smoker  . Smokeless tobacco: Never Used  . Alcohol use No    Review of Systems  Constitutional: No fever/chills. Eyes: No visual changes. ENT: No sore throat. Cardiovascular: Denies chest pain. Respiratory: Denies shortness of breath. Gastrointestinal: Positive for abdominal pain.  No nausea, no vomiting.  No diarrhea.  No constipation. Genitourinary: Negative for dysuria. Musculoskeletal: Negative for back pain. Skin: Negative for rash. Neurological: Negative for headaches, focal weakness or numbness.   ____________________________________________   PHYSICAL EXAM:  VITAL SIGNS: ED Triage Vitals [07/04/16 2337]  Enc Vitals Group     BP (!) 145/81     Pulse Rate 66     Resp 20     Temp 98.6  F (37 C)     Temp Source Oral     SpO2 99 %     Weight      Height      Head Circumference      Peak Flow      Pain Score 6     Pain Loc      Pain Edu?      Excl. in GC?     Constitutional: Alert and oriented. Well appearing and in no acute distress. Eyes: Conjunctivae are normal. PERRL.  EOMI. Head: Atraumatic. Nose: No congestion/rhinnorhea. Mouth/Throat: Mucous membranes are moist.  Oropharynx non-erythematous. Neck: No stridor.   Cardiovascular: Normal rate, regular rhythm. Grossly normal heart sounds.  Good peripheral circulation. Respiratory: Normal respiratory effort.  No retractions. Lungs CTAB. Gastrointestinal: Soft and mildly tender to palpation right upper quadrant without rebound or guarding. No distention. No abdominal bruits. No CVA tenderness. Musculoskeletal: No lower extremity tenderness nor edema.  No joint effusions. Neurologic:  Normal speech and language. No gross focal neurologic deficits are appreciated. No gait instability. Skin:  Skin is warm, dry and intact. No rash noted. Psychiatric: Mood and affect are normal. Speech and behavior are normal.  ____________________________________________   LABS (all labs ordered are listed, but only abnormal results are displayed)  Labs Reviewed  COMPREHENSIVE METABOLIC PANEL - Abnormal; Notable for the following:       Result Value   Glucose, Bld 112 (*)    All other components within normal limits  URINALYSIS, COMPLETE (UACMP) WITH MICROSCOPIC - Abnormal; Notable for the following:    Color, Urine YELLOW (*)    APPearance HAZY (*)    Hgb urine dipstick LARGE (*)    Protein, ur 30 (*)    Leukocytes, UA TRACE (*)    Squamous Epithelial / LPF 0-5 (*)    All other components within normal limits  LIPASE, BLOOD  CBC  TROPONIN I   ____________________________________________  EKG  ED ECG REPORT I, Estell Puccini J, the attending physician, personally viewed and interpreted this ECG.   Date: 07/05/2016  EKG Time: 2340  Rate: 75  Rhythm: normal EKG, normal sinus rhythm  Axis: Normal  Intervals:none  ST&T Change: Nonspecific  ____________________________________________  RADIOLOGY  Abdominal ultrasound interpreted per Dr. Manus Gunning: Normal right upper quadrant ultrasound.  CT Abdomen Pelvis  interpreted per Dr. Manus Gunning: No acute abnormality. No evidence of complication post recent  appendectomy.   ____________________________________________   PROCEDURES  Procedure(s) performed: None  Procedures  Critical Care performed: No  ____________________________________________   INITIAL IMPRESSION / ASSESSMENT AND PLAN / ED COURSE  Pertinent labs & imaging results that were available during my care of the patient were reviewed by me and considered in my medical decision making (see chart for details).  36 year old female who presents with abdominal pain; status post lap appendectomy approximately 1 month ago. She is afebrile with normal white count. Right upper quadrant tender on examination. Will start with ultrasound and consider CT scan if ultrasound is negative.  Clinical Course as of Jul 06 702  Sun Jul 05, 2016  7829 Updated patients on negative ultrasound results. She is still tender to palpation. Will proceed to CT abdomen/pelvis.  [JS]  0701 Updated patient of negative CT results. I personally viewed the scout film; there is moderate stool burden. Will discharge home with lactulose; short course of antibiotic and she will follow up with her PCP next week. Strict return precautions given. Patient verbalizes understanding and agrees with plan of care.  [  JS]    Clinical Course User Index [JS] Irean Hong, MD     ____________________________________________   FINAL CLINICAL IMPRESSION(S) / ED DIAGNOSES  Final diagnoses:  Right upper quadrant abdominal pain  Constipation, unspecified constipation type      NEW MEDICATIONS STARTED DURING THIS VISIT:  New Prescriptions   No medications on file     Note:  This document was prepared using Dragon voice recognition software and may include unintentional dictation errors.    Irean Hong, MD 07/05/16 [phone]

## 2018-03-20 IMAGING — CT CT ABD-PELV W/ CM
2 of 4 series · 16 of 46 positions shown, 18 images · IV contrast (APPLIED)
Comparison: None.

CLINICAL DATA: Right lower quadrant pain started yesterday. Trouble
urinating.

EXAM:
CT ABDOMEN AND PELVIS WITH CONTRAST
TECHNIQUE: Multidetector CT imaging of the abdomen and pelvis was performed
using the standard protocol following bolus administration of
intravenous contrast.
CONTRAST:  100mL CN5BQ6-IKK IOPAMIDOL (CN5BQ6-IKK) INJECTION 61%

[Series 2: routine abd/pel with · axial · 0.74mm/px · z∈[-906,-446]mm · 13 of 101 slices shown, 15 images]
[im 5/101  soft-tissue]
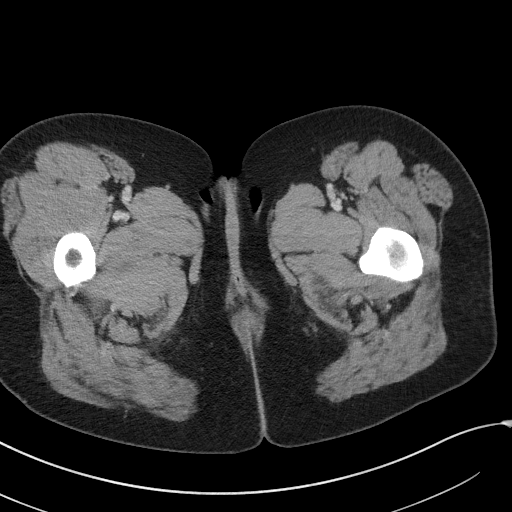
[im 5/101  bone]
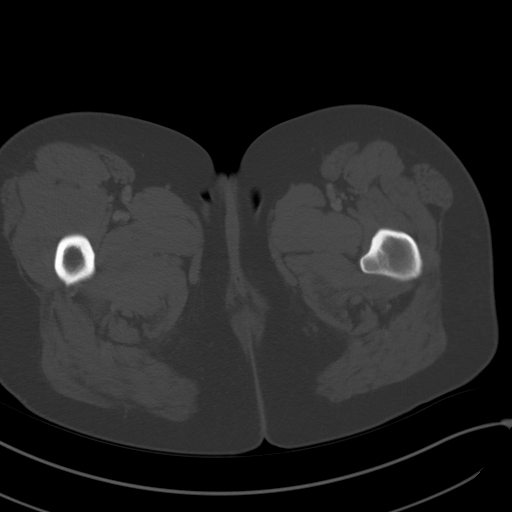
[im 13/101  soft-tissue]
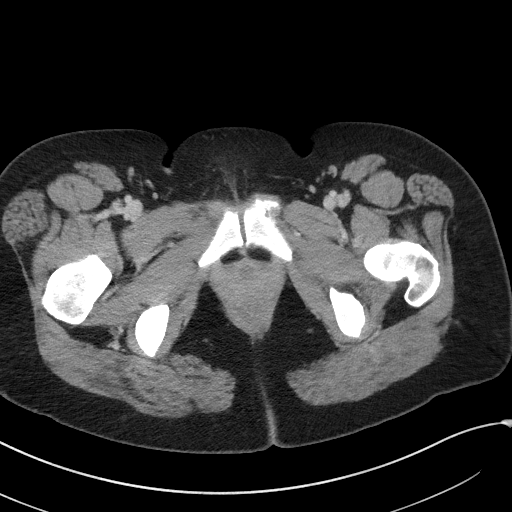
[im 21/101  soft-tissue]
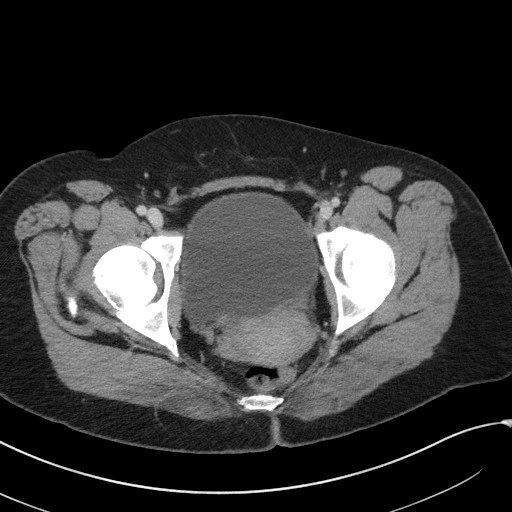
[im 29/101  soft-tissue]
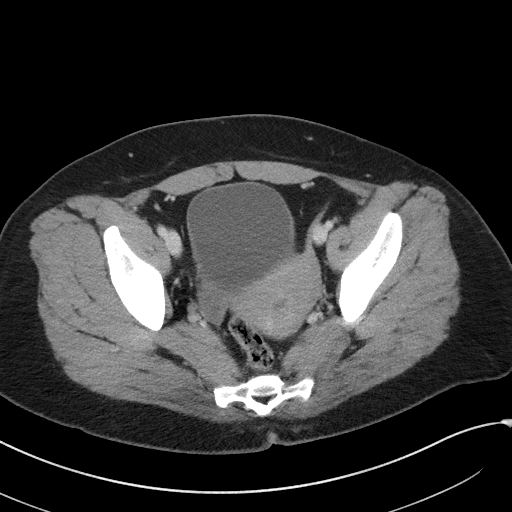
[im 37/101  soft-tissue]
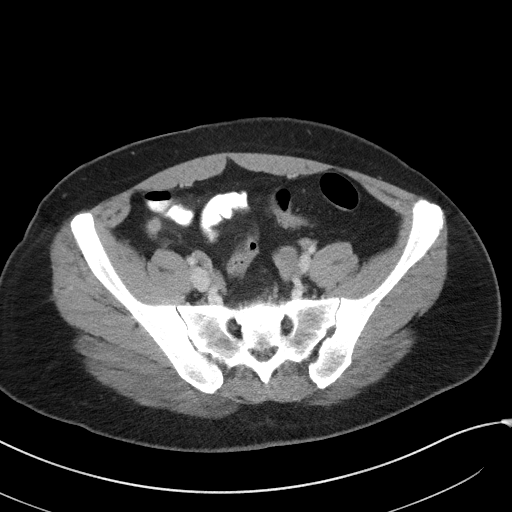
[im 45/101  soft-tissue]
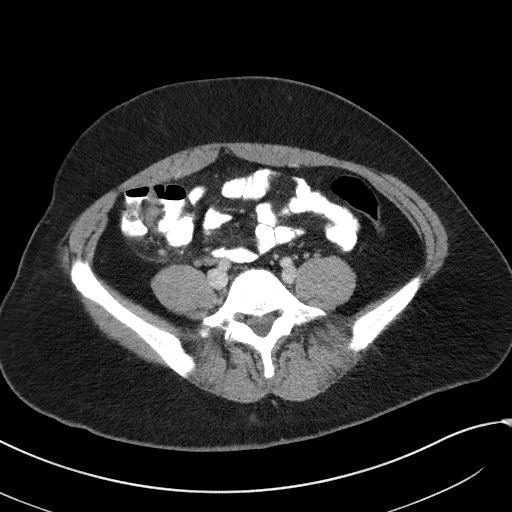
[im 53/101  soft-tissue]
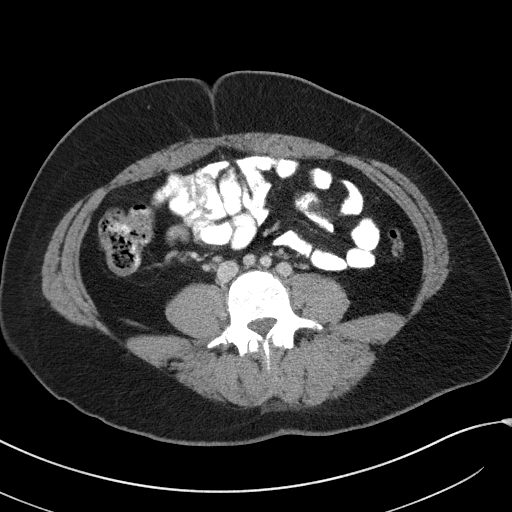
[im 57/101  soft-tissue]
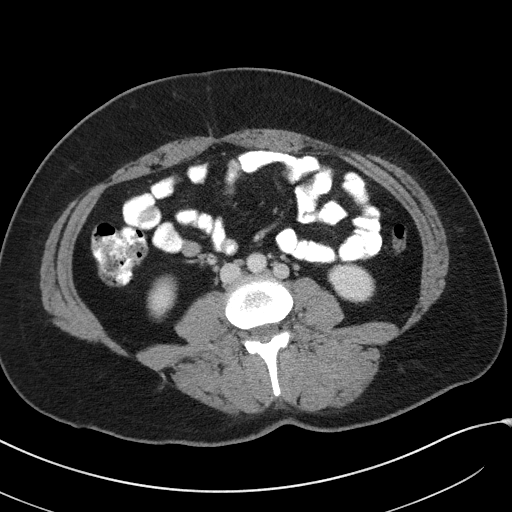
[im 65/101  soft-tissue]
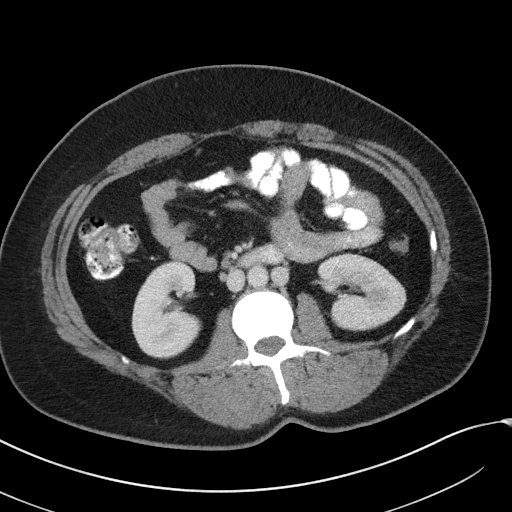
[im 65/101  bone]
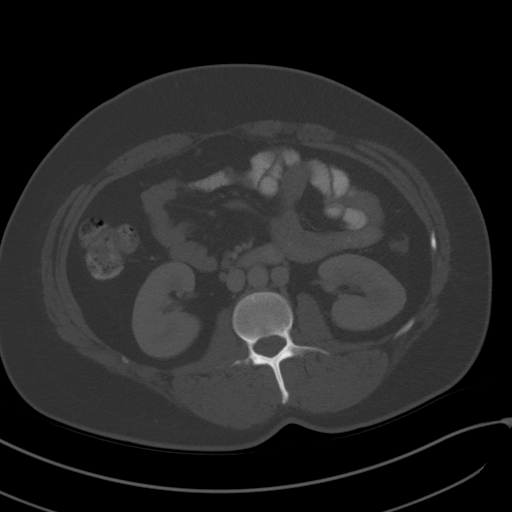
[im 73/101  soft-tissue]
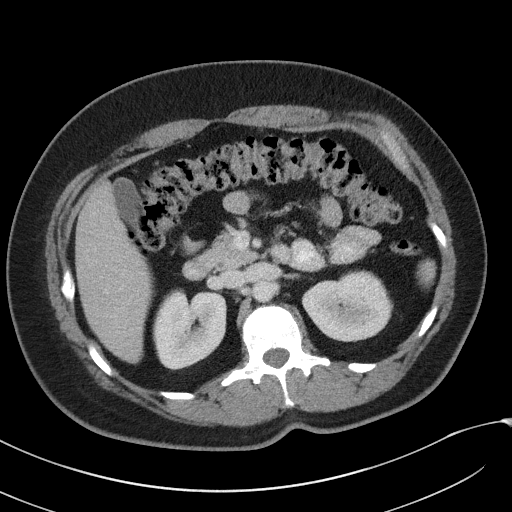
[im 81/101  soft-tissue]
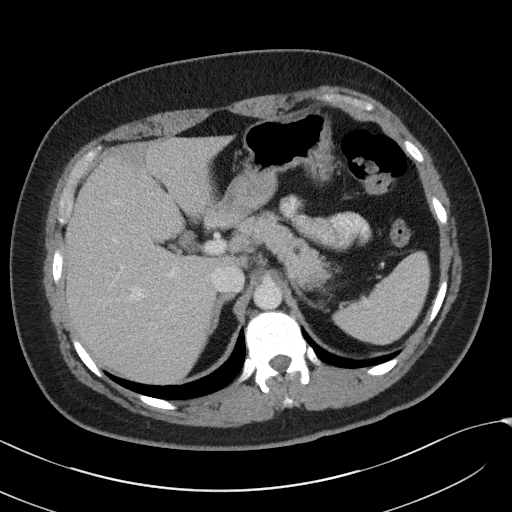
[im 89/101  soft-tissue]
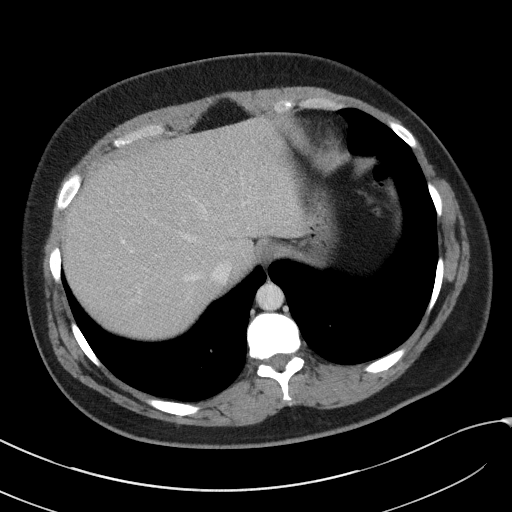
[im 97/101  soft-tissue]
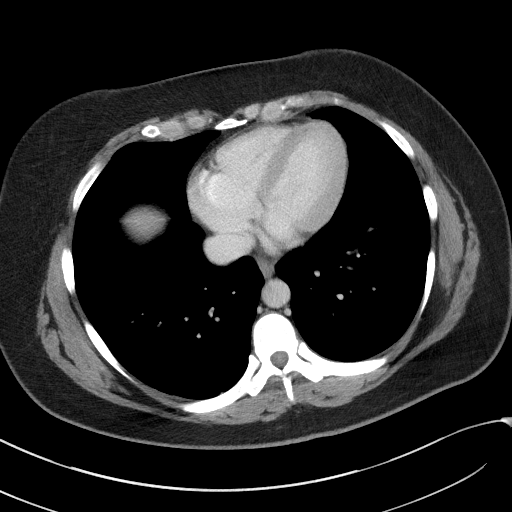

[Series 5: coronal st · coronal · 0.71mm/px · 3 of 94 slices shown]
[im 32/94  soft-tissue]
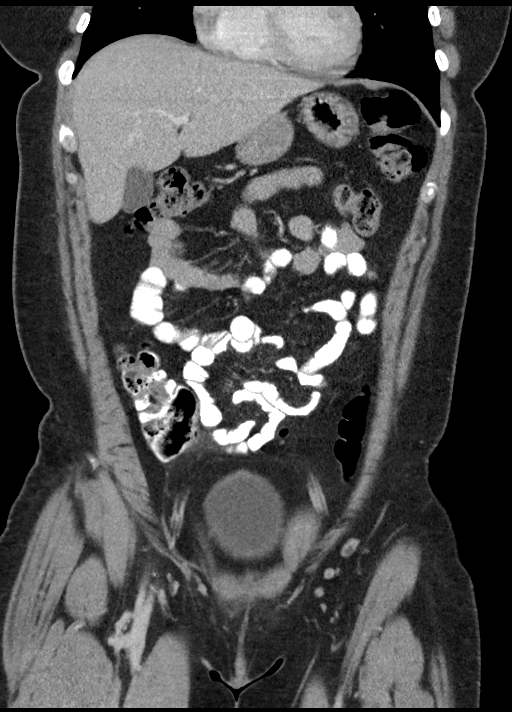
[im 42/94  soft-tissue]
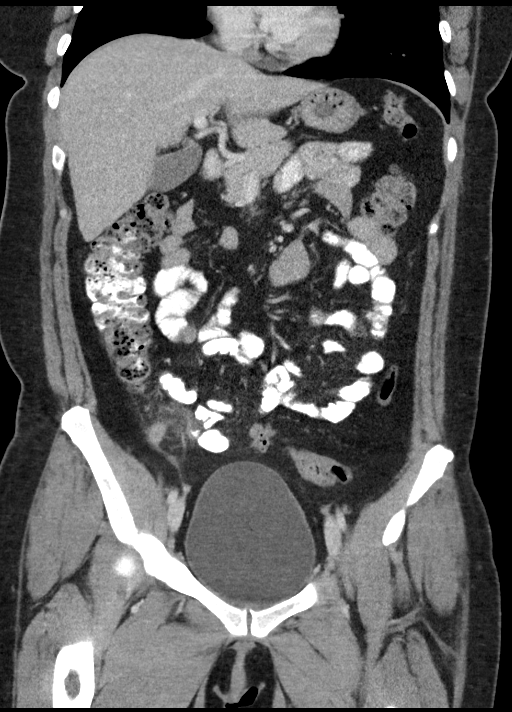
[im 52/94  soft-tissue]
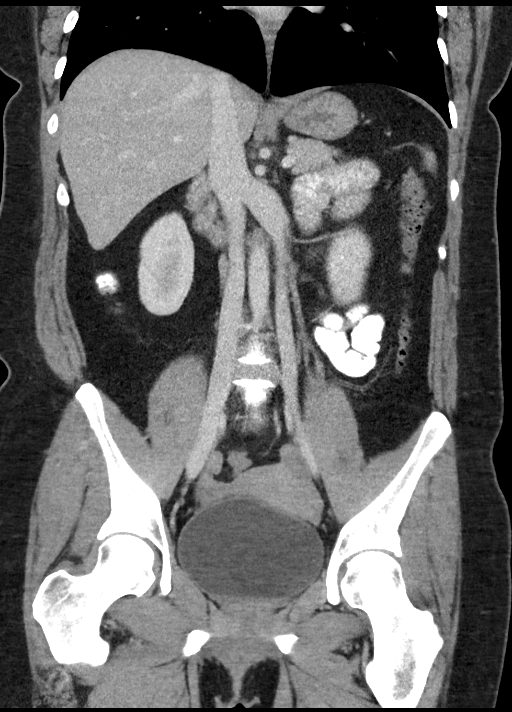

[16 of 46 positions shown; findings below may reference images not displayed]

FINDINGS: Lower chest: No acute abnormality.

Hepatobiliary: Low attenuation of the left hepatic lobe adjacent to
the falciform ligament most consistent with focal fatty deposition.
No other focal hepatic mass. No gallstones, gallbladder wall
thickening, or biliary dilatation.

Pancreas: Unremarkable. No pancreatic ductal dilatation or
surrounding inflammatory changes.

Spleen: Normal in size without focal abnormality.

Adrenals/Urinary Tract: Adrenal glands are unremarkable. Kidneys are
normal, without renal calculi, focal lesion, or hydronephrosis.
Bladder is unremarkable.

Stomach/Bowel: Stomach is within normal limits. Dilated appendix
measuring 11 mm in diameter with periappendiceal inflammatory
changes most consistent with acute appendicitis. No focal fluid
collection to suggest an abscess. No evidence of other bowel wall
thickening, distention, or inflammatory changes.

Vascular/Lymphatic: No significant vascular findings are present. No
enlarged abdominal or pelvic lymph nodes.

Reproductive: Uterus and bilateral adnexa are unremarkable.

Other: No abdominal wall hernia or abnormality. No abdominopelvic
ascites.

Musculoskeletal: No acute or significant osseous findings.
IMPRESSION: 1. Findings consistent with acute appendicitis.

## 2021-05-12 ENCOUNTER — Ambulatory Visit: Payer: Self-pay

## 2021-05-12 ENCOUNTER — Encounter: Payer: Self-pay | Admitting: Family Medicine

## 2021-05-13 ENCOUNTER — Other Ambulatory Visit: Payer: Self-pay

## 2021-05-13 ENCOUNTER — Ambulatory Visit (LOCAL_COMMUNITY_HEALTH_CENTER): Payer: Self-pay | Admitting: Family Medicine

## 2021-05-13 ENCOUNTER — Encounter: Payer: Self-pay | Admitting: Family Medicine

## 2021-05-13 VITALS — BP 123/79 | Ht 66.0 in | Wt 186.4 lb

## 2021-05-13 DIAGNOSIS — E66811 Obesity, class 1: Secondary | ICD-10-CM

## 2021-05-13 DIAGNOSIS — Z3009 Encounter for other general counseling and advice on contraception: Secondary | ICD-10-CM

## 2021-05-13 DIAGNOSIS — E669 Obesity, unspecified: Secondary | ICD-10-CM

## 2021-05-13 DIAGNOSIS — N6012 Diffuse cystic mastopathy of left breast: Secondary | ICD-10-CM

## 2021-05-13 DIAGNOSIS — N6011 Diffuse cystic mastopathy of right breast: Secondary | ICD-10-CM

## 2021-05-13 LAB — HM HIV SCREENING LAB: HM HIV Screening: NEGATIVE

## 2021-05-13 MED ORDER — XULANE 150-35 MCG/24HR TD PTWK: 1.0000 | MEDICATED_PATCH | TRANSDERMAL | 12 refills | Status: AC

## 2021-05-13 NOTE — Progress Notes (Signed)
[facility] DEPARTMENT ?[facility] ?[address]- [employer] ?Main Number: [phone] ? ? ? ?Family Planning Visit- Initial Visit ? ?Subjective:  ?Jodi Hamilton is a 41 y.o.  [id number]   being seen today for an initial annual visit and to discuss reproductive life planning.  The patient is currently using Female Condom for pregnancy prevention.  Client denies unprotected sex since LMP.  Patient reports   does not want a pregnancy in the next year. ?Client states that she is frequently having upper R side pain that radiated to her back afater eating certain foods.  States that red meat, greasy foods and sodas cause the pain.   States the mother had her GB removed a couple years ago.   ? ? report they are looking for a method that provides Cycle control, High efficacy at preventing pregnancy, Method that is not associated with weight gain, and Discrete method ? ?Patient has the following medical conditions has Acute appendicitis with localized peritonitis; Appendicitis, acute; and GERD (gastroesophageal reflux disease) on their problem list. ? ?Chief Complaint  ?Patient presents with  ? Annual Exam  ?  BC  ? ? ?Patient reports she is here for her physical and  another birth control method.  She and husband are using condoms for [facility].  She has used the IUD and Depo in the past.  Depo made her gain weight. ?Client states that her R breast is tender at the lower quadrant when it's time for her menses.  She can feel a lump in this area.  States that pain resolves after period  Denies  nipple discharge, change in skin color or a breast lump/mass. ? ?Patient denies other concerns.  ? ?Body mass index is 30.09 kg/m?. - Patient is eligible for diabetes screening based on BMI and age >60?  yes ?HA1C ordered? no ? ?Patient reports 1  partner/s in last year. Desires STI screening?  Yes ? ?Has patient been screened once for HCV in the past?  No ? No results found for: HCVAB ? ?Does the patient have  current drug use (including MJ), have a partner with drug use, and/or has been incarcerated since last result? No  ?If yes-- Screen for HCV through [facility] ?  ?Does the patient meet criteria for HBV testing? No ? ?Criteria:  ?-Household, sexual or needle sharing contact with HBV ?-History of drug use ?-HIV positive ?-Those with known Hep C ? ? ?Health Maintenance Due  ?Topic Date Due  ? COVID-19 Vaccine (1) Never done  ? Hepatitis C Screening  Never done  ? PAP SMEAR-Modifier  Never done  ? INFLUENZA VACCINE  Never done  ? ? ?Review of Systems  ?All other systems reviewed and are negative. ? ?The following portions of the patient's history were reviewed and updated as appropriate: allergies, current medications, past family history, past medical history, past social history, past surgical history and problem list. Problem list updated. ? ? ?See flowsheet for other program required questions. ? ?Objective:  ? ?Vitals:  ? 05/13/21 1004  ?BP: 123/79  ?Weight: 186 lb 6.4 oz (84.6 kg)  ?Height: 5\' 6"  (1.676 m)  ? ? ?Physical Exam ?Constitutional:   ?   Appearance: Normal appearance.  ?HENT:  ?   Head: Normocephalic and atraumatic.  ?Pulmonary:  ?   Effort: Pulmonary effort is normal.  ?Chest:  ?Breasts: ?   Breasts are symmetrical.  ?   Right: Normal.  ?   Left: Normal.  ?  Comments: R breast- thickened breast tissue lower breast area below nipple, about 3-4 cm in length, firm, mobile, slight tenderness on palpation (client states that her period will start 1-2 days) ?Abdominal:  ?   Palpations: Abdomen is soft.  ?   Hernia: There is no hernia in the left inguinal area or right inguinal area.  ?Genitourinary: ?   General: Normal vulva.  ?   Exam position: Lithotomy position.  ?   Pubic Area: No rash or pubic lice.   ?   Labia:     ?   Right: No rash, tenderness, lesion or injury.     ?   Left: No rash, tenderness, lesion or injury.   ?   Urethra: No prolapse or urethral lesion.  ?   Vagina: Normal.  ?   Cervix:  Normal.  ?   Uterus: Normal.   ?   Adnexa: Right adnexa normal and left adnexa normal.  ?   Rectum: Normal.  ?Musculoskeletal:     ?   General: Normal range of motion.  ?Lymphadenopathy:  ?   Upper Body:  ?   Right upper body: No supraclavicular, axillary or pectoral adenopathy.  ?   Left upper body: No supraclavicular, axillary or pectoral adenopathy.  ?   Lower Body: No right inguinal adenopathy.  ?Skin: ?   General: Skin is warm and dry.  ?Neurological:  ?   General: No focal deficit present.  ?   Mental Status: She is alert.  ?Psychiatric:     ?   Mood and Affect: Mood normal.     ?   Behavior: Behavior normal.  ? ? ? ? ?Assessment and Plan:  ?[name] is a 41 y.o. female presenting to the [facility] Department for an initial annual wellness/contraceptive visit ? ?Contraception counseling: Reviewed options based on patient desire and reproductive life plan. Patient is interested in Contraceptive Patch. This was provided to the patient today. ? ?Risks, benefits, and typical effectiveness rates were reviewed.  Questions were answered.  Written information was also given to the patient to review.   ? ?The patient will follow up in  3 weeks for 2 more Xulane boxes.  The patient was told to call with any further questions, or with any concerns about this method of contraception.  Emphasized use of condoms 100% of the time for STI prevention. ? ?Need for ECP was assessed. ? ?1. Family planning ? ?- IGP, [facility] HPV ?- HIV [facility] ?- Syphilis Serology, [facility] ?- Chlamydia/Gonorrhea [place] [facility] ?- norelgestromin-ethinyl estradiol Burr Medico) 150-35 MCG/24HR transdermal patch; Place 1 patch onto the skin once a week.  Dispense: 3 patch; Refill: 12 ? ?2. Obesity 30.0-34.9 ?  ?Client plans to have her GI concerns, AIC  and other labs when she is evaluated by her PCP.  She plans to make an appt. @ [facility].  ? ?3. Fibrocystic changes in both Breasts ?Co. That her breasts  tenderness is related to her menstrual cycle and tends to worsen until she has her menses. ?Co that starting her Burr Medico may help to lessen the breast tenderness that she has had with her menstrual cycles. ?Encouraged her to continue to do her SBE. ? ?Return in about 3 years (around 05/13/2024) for 2 boxes of Xulane . ? ?No future appointments. ? ?Larene Pickett, FNP ? ?

## 2021-05-13 NOTE — Progress Notes (Signed)
Patient seen today for PE and BCM. Patient given one box of BC patch. Condoms given to patient. PCP list given.  ?

## 2021-05-14 NOTE — Progress Notes (Signed)
Patient was given one box of patches. Patient will need to come in for additional refills.  ?

## 2021-05-21 LAB — IGP, APTIMA HPV
HPV Aptima: NEGATIVE
PAP Smear Comment: 0

## 2021-08-04 NOTE — Progress Notes (Unsigned)
PAP Normal and HPV Negative.  Repeat PAP in 5 years (04-2026) per Lyndel Safe, MD.  [place] PAP card mailed today.  Routed 08-04-2021.  Hart Carwin, RN

## 2021-08-04 NOTE — Progress Notes (Unsigned)
Late Entry Pap smear triage  NIL, HPV negative  Next pap in 5 years

## 2021-12-30 ENCOUNTER — Other Ambulatory Visit: Payer: Self-pay | Admitting: Family Medicine

## 2021-12-30 DIAGNOSIS — Z1231 Encounter for screening mammogram for malignant neoplasm of breast: Secondary | ICD-10-CM
